# Patient Record
Sex: Female | Born: 1945 | Race: White | Hispanic: No | Marital: Married | Smoking: Light tobacco smoker
Health system: Southern US, Community
[De-identification: ages and names within clinical notes are randomized; demographics above are authoritative.]

## PROBLEM LIST (undated history)

## (undated) DIAGNOSIS — E785 Hyperlipidemia, unspecified: Secondary | ICD-10-CM

## (undated) DIAGNOSIS — F419 Anxiety disorder, unspecified: Secondary | ICD-10-CM

## (undated) DIAGNOSIS — K219 Gastro-esophageal reflux disease without esophagitis: Secondary | ICD-10-CM

## (undated) DIAGNOSIS — Q453 Other congenital malformations of pancreas and pancreatic duct: Principal | ICD-10-CM

## (undated) DIAGNOSIS — R748 Abnormal levels of other serum enzymes: Secondary | ICD-10-CM

## (undated) DIAGNOSIS — E039 Hypothyroidism, unspecified: Secondary | ICD-10-CM

## (undated) HISTORY — DX: Gastro-esophageal reflux disease without esophagitis: K21.9

## (undated) HISTORY — DX: Hyperlipidemia, unspecified: E78.5

## (undated) HISTORY — PX: UPPER GASTROINTESTINAL ENDOSCOPY: SHX188

## (undated) HISTORY — PX: ESOPHAGOGASTRODUODENOSCOPY: SHX1529

## (undated) HISTORY — PX: TUBAL LIGATION: SHX77

## (undated) HISTORY — PX: COLONOSCOPY: SHX174

## (undated) HISTORY — DX: Abnormal levels of other serum enzymes: R74.8

## (undated) HISTORY — DX: Hypothyroidism, unspecified: E03.9

## (undated) HISTORY — DX: Other congenital malformations of pancreas and pancreatic duct: Q45.3

---

## 2002-04-12 ENCOUNTER — Encounter: Payer: Self-pay | Admitting: Internal Medicine

## 2002-04-12 ENCOUNTER — Ambulatory Visit (HOSPITAL_COMMUNITY): Admission: RE | Admit: 2002-04-12 | Discharge: 2002-04-12 | Payer: Self-pay | Admitting: Internal Medicine

## 2002-05-04 ENCOUNTER — Encounter: Payer: Self-pay | Admitting: Internal Medicine

## 2002-05-04 ENCOUNTER — Ambulatory Visit (HOSPITAL_COMMUNITY): Admission: RE | Admit: 2002-05-04 | Discharge: 2002-05-04 | Payer: Self-pay | Admitting: Internal Medicine

## 2002-07-26 ENCOUNTER — Ambulatory Visit (HOSPITAL_COMMUNITY): Admission: RE | Admit: 2002-07-26 | Discharge: 2002-07-26 | Payer: Self-pay | Admitting: Internal Medicine

## 2003-02-19 ENCOUNTER — Encounter: Payer: Self-pay | Admitting: Emergency Medicine

## 2003-02-19 ENCOUNTER — Emergency Department (HOSPITAL_COMMUNITY): Admission: EM | Admit: 2003-02-19 | Discharge: 2003-02-19 | Payer: Self-pay | Admitting: Emergency Medicine

## 2005-10-28 ENCOUNTER — Ambulatory Visit (HOSPITAL_COMMUNITY): Admission: RE | Admit: 2005-10-28 | Discharge: 2005-10-28 | Payer: Self-pay | Admitting: Obstetrics and Gynecology

## 2007-04-27 ENCOUNTER — Encounter: Payer: Self-pay | Admitting: Internal Medicine

## 2007-05-29 ENCOUNTER — Encounter: Payer: Self-pay | Admitting: Internal Medicine

## 2007-05-29 HISTORY — PX: COLONOSCOPY: SHX174

## 2007-06-13 ENCOUNTER — Encounter: Payer: Self-pay | Admitting: Internal Medicine

## 2007-08-11 ENCOUNTER — Ambulatory Visit (HOSPITAL_COMMUNITY): Admission: RE | Admit: 2007-08-11 | Discharge: 2007-08-11 | Payer: Self-pay | Admitting: *Deleted

## 2007-08-15 ENCOUNTER — Emergency Department (HOSPITAL_COMMUNITY): Admission: EM | Admit: 2007-08-15 | Discharge: 2007-08-15 | Payer: Self-pay | Admitting: Emergency Medicine

## 2008-02-28 ENCOUNTER — Encounter: Payer: Self-pay | Admitting: Internal Medicine

## 2008-03-19 ENCOUNTER — Ambulatory Visit (HOSPITAL_COMMUNITY): Admission: RE | Admit: 2008-03-19 | Discharge: 2008-03-19 | Payer: Self-pay | Admitting: Family Medicine

## 2008-03-21 ENCOUNTER — Ambulatory Visit (HOSPITAL_COMMUNITY): Admission: RE | Admit: 2008-03-21 | Discharge: 2008-03-21 | Payer: Self-pay | Admitting: Family Medicine

## 2009-07-09 ENCOUNTER — Ambulatory Visit (HOSPITAL_COMMUNITY): Admission: RE | Admit: 2009-07-09 | Discharge: 2009-07-09 | Payer: Self-pay | Admitting: Family Medicine

## 2009-08-01 ENCOUNTER — Encounter: Payer: Self-pay | Admitting: Internal Medicine

## 2009-12-20 HISTORY — PX: BRAVO PH STUDY: SHX5421

## 2009-12-23 ENCOUNTER — Encounter: Payer: Self-pay | Admitting: Internal Medicine

## 2009-12-24 ENCOUNTER — Encounter: Payer: Self-pay | Admitting: Internal Medicine

## 2010-01-02 ENCOUNTER — Ambulatory Visit (HOSPITAL_COMMUNITY): Admission: RE | Admit: 2010-01-02 | Discharge: 2010-01-02 | Payer: Self-pay | Admitting: Internal Medicine

## 2010-01-02 ENCOUNTER — Encounter: Payer: Self-pay | Admitting: Internal Medicine

## 2010-02-12 ENCOUNTER — Encounter (HOSPITAL_COMMUNITY): Admission: RE | Admit: 2010-02-12 | Discharge: 2010-03-14 | Payer: Self-pay | Admitting: Internal Medicine

## 2010-02-12 ENCOUNTER — Encounter (INDEPENDENT_AMBULATORY_CARE_PROVIDER_SITE_OTHER): Payer: Self-pay | Admitting: *Deleted

## 2010-02-18 ENCOUNTER — Encounter (INDEPENDENT_AMBULATORY_CARE_PROVIDER_SITE_OTHER): Payer: Self-pay | Admitting: *Deleted

## 2010-02-20 ENCOUNTER — Telehealth: Payer: Self-pay | Admitting: Internal Medicine

## 2010-02-23 ENCOUNTER — Encounter: Payer: Self-pay | Admitting: Internal Medicine

## 2010-02-25 ENCOUNTER — Ambulatory Visit: Payer: Self-pay | Admitting: Internal Medicine

## 2010-02-25 ENCOUNTER — Telehealth: Payer: Self-pay | Admitting: Internal Medicine

## 2010-02-25 DIAGNOSIS — K219 Gastro-esophageal reflux disease without esophagitis: Secondary | ICD-10-CM | POA: Insufficient documentation

## 2010-02-25 DIAGNOSIS — J029 Acute pharyngitis, unspecified: Secondary | ICD-10-CM | POA: Insufficient documentation

## 2010-03-12 ENCOUNTER — Telehealth: Payer: Self-pay | Admitting: Internal Medicine

## 2010-04-07 ENCOUNTER — Encounter (INDEPENDENT_AMBULATORY_CARE_PROVIDER_SITE_OTHER): Payer: Self-pay | Admitting: *Deleted

## 2010-04-17 ENCOUNTER — Telehealth: Payer: Self-pay | Admitting: Internal Medicine

## 2010-04-20 ENCOUNTER — Encounter (INDEPENDENT_AMBULATORY_CARE_PROVIDER_SITE_OTHER): Payer: Self-pay | Admitting: *Deleted

## 2010-04-20 ENCOUNTER — Ambulatory Visit: Payer: Self-pay | Admitting: Internal Medicine

## 2010-04-28 ENCOUNTER — Ambulatory Visit (HOSPITAL_COMMUNITY): Admission: RE | Admit: 2010-04-28 | Discharge: 2010-04-28 | Payer: Self-pay | Admitting: Internal Medicine

## 2010-04-28 ENCOUNTER — Ambulatory Visit: Payer: Self-pay | Admitting: Internal Medicine

## 2010-04-28 ENCOUNTER — Telehealth: Payer: Self-pay | Admitting: Gastroenterology

## 2010-05-05 ENCOUNTER — Telehealth: Payer: Self-pay | Admitting: Internal Medicine

## 2010-09-08 ENCOUNTER — Telehealth: Payer: Self-pay | Admitting: Internal Medicine

## 2011-01-10 ENCOUNTER — Encounter: Payer: Self-pay | Admitting: Family Medicine

## 2011-01-10 ENCOUNTER — Encounter: Payer: Self-pay | Admitting: Internal Medicine

## 2011-01-19 NOTE — Progress Notes (Signed)
Summary: speak to stephanie  Phone Note Call from Patient Call back at Home Phone [phone]   Caller: Patient Call For: Leone Payor Reason for Call: Talk to Nurse Summary of Call: Patient wants to speak directly to [facility] regarding an rx Initial call taken by: Tawni Levy,  March 12, 2010 2:08 PM  Follow-up for Phone Call        message left for pt to return call. Francee Piccolo CMA Duncan Dull)  March 12, 2010 3:36 PM   RC from pt.  She states the Dexilant is helping.  She is still having symptoms, but they are better.  Pt states she is also taking a teaspoon of honey before eating and this is helping.  Please advise  of plan. Follow-up by: Francee Piccolo CMA Duncan Dull),  March 13, 2010 2:34 PM  Additional Follow-up for Phone Call Additional follow up Details #1::        see rx honey is up to her - I cam neutral on that she may need the discount card REV 2months Additional Follow-up by: [name] MD, Clementeen Graham,  March 15, 2010 4:38 PM    Additional Follow-up for Phone Call Additional follow up Details #2::    RC from pt.  Advised of above.  I mailed pt a GERD diet and discount card.  Appt made for 05/04/10 @ 2pm.  Pt will call before then if symptoms are not better. Follow-up by: Francee Piccolo CMA Duncan Dull),  March 17, 2010 3:01 PM  New/Updated Medications: DEXILANT 60 MG CPDR (DEXLANSOPRAZOLE) Take 1 tablet by mouth once a day 30 minutes before breakfast Prescriptions: DEXILANT 60 MG CPDR (DEXLANSOPRAZOLE) Take 1 tablet by mouth once a day 30 minutes before breakfast  #30 x 11   Entered and Authorized by:   [name] MD, [facility]   Signed by:   [name] MD, [facility] on 03/15/2010   Method used:   Electronically to        [facility] (retail)       [address]       [place], [place]  [address]       Ph: [phone]       Fax: [phone]   RxID:   [id number]

## 2011-01-19 NOTE — Assessment & Plan Note (Signed)
Summary: REFLUX, DEXILANT NOT WORKING/SP   History of Present Illness Visit Type: Follow-up Visit Primary GI MD: Stan Head MD [facility] Primary Provider: Elfredia Nevins, MD Chief Complaint: Acid Reflux and some dysphagia History of Present Illness:   65 yo woman with GERD on chronic PPI. She feels like there is alot of snot coming down her throat in the AM. Not sleeping well, having sweats from the neck up (same since menopause). She started black cohash nut not on it long. She has eliminated caffeine. She still has burning n her throat.  Dexilant helped some, better than other PPI's but problems not eliminated. Still feels like something is sitting in her throat, occasionally. No lear relation to food, etc.    GI Review of Systems    Reports acid reflux, dysphagia with solids, and  heartburn.      Denies abdominal pain, belching, bloating, chest pain, dysphagia with liquids, loss of appetite, nausea, vomiting, vomiting blood, weight loss, and  weight gain.        Denies anal fissure, black tarry stools, change in bowel habit, constipation, diarrhea, diverticulosis, fecal incontinence, heme positive stool, hemorrhoids, irritable bowel syndrome, jaundice, light color stool, liver problems, rectal bleeding, and  rectal pain.    Current Medications (verified): 1)  Levothyroxine Sodium 100 Mcg Tabs (Levothyroxine Sodium) .... Take One By Mouth Once Daily 2)  Dexilant 60 Mg Cpdr (Dexlansoprazole) .... Take 1 Tablet By Mouth Once A Day 30 Minutes Before Breakfast 3)  Carafate 1 Gm/30ml Susp (Sucralfate) .... Take 2 Tsp By Mouth Four Times A Day--Hold 4)  Fish Oil 1000 Mg Caps (Omega-3 Fatty Acids) .... Take Three By Mouth Once Daily 5)  Milk Thistle 140 Mg Caps (Milk Thistle) .... Take One By Mouth Once Daily 6)  B Complex  Tabs (B Complex Vitamins) .... Take One By Mouth Once Daily 7)  Cinnamon 500 Mg Caps (Cinnamon) .... Take Two By Mouth Once Daily 8)  Calcium 1200-1000 Mg-Unit Chew  (Calcium Carbonate-Vit D-Min) .... Take One By Mouth Once Daily 9)  Co-Enzyme Q-10 100 Mg Caps (Coenzyme Q10) .... Take One By Mouth Once Daily 10)  Acidophilus  Caps (Lactobacillus) .... Take One By Mouth Once Daily  Allergies (verified): No Known Drug Allergies  Past History:  Past Medical History: Reviewed history from 02/25/2010 and no changes required. Anxiety Disorder GERD/LPR...Marland KitchenMarland KitchenBates Hyperlipidemia Hypothyroidism  Past Surgical History: Reviewed history from 02/25/2010 and no changes required. Tubal Ligation  Family History: Reviewed history from 02/25/2010 and no changes required. Family History of Colon Cancer: MGF Family History of Diabetes: Mother Family History of Heart Disease: mother  Social History: Reviewed history from 02/25/2010 and no changes required. 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  Alcohol Use - no Daily Caffeine Use 4-5 per day Illicit Drug Use - no  Review of Systems       still with anxiety over mother and mother-in-law's health problems  Vital Signs:  Patient profile:   65 year old female Height:      61 inches Weight:      148 pounds BMI:     28.07 Pulse rhythm:   regular BP sitting:   122 / 72  (left arm)  Vitals Entered By: Milford Cage NCMA (Apr 20, 2010 2:50 PM)  Physical Exam  General:  Well developed, well nourished, no acute distress. Mouth:  mild erythema at palatal folds and uvula eographic togue Lungs:  Clear throughout to auscultation. Heart:  Regular rate and rhythm; no  murmurs, rubs,  or bruits.   Impression & Recommendations:  Problem # 1:  GERD (ICD-530.81) Assessment Unchanged  ? slightly better on Dexilant as opposed to other PPI's (seems like heartburn mainly controlled) ? if she has LPR  Orders: ZEGD (ZEGD)  Problem # 2:  SORE THROAT (ICD-462) Assessment: Unchanged ? if GERD is cause or other ENT causes plan for EGD/Bravo pH probe on Dexilantnow, ? repeat ENT  assessment  Patient Instructions: 1)  Copy sent to : Christia Reading, MD and Elfredia Nevins, MD 2)  Upper Endoscopy brochure given.  3)  We will see you at your procedure on 04/28/10  4)  The medication list was reviewed and reconciled.  All changed / newly prescribed medications were explained.  A complete medication list was provided to the patient / caregiver.

## 2011-01-19 NOTE — Assessment & Plan Note (Signed)
Summary: HEARTBURN,REFLUX/SP   History of Present Illness Visit Type: Initial Visit Primary GI MD: Stan Head MD [facility] Primary Provider: Elfredia Nevins, MD Chief Complaint: Mid back pain and burning in her throat History of Present Illness:   65 yo white woman with throat burning is worried about cancer 03/02/07 EGD normal (patient info) Was to do research on GERD has seen bates throat burning and neck, especially right side and is sore in right submandibuar Dr. Jenne Pane did laryngoscopy.  Nexium used to work fine x years in January she had a bitternes in mouth x 3-4 days  then had galbadder [place] and HIDA ef ok sxs better can get epigastric pain after apple  Now on Aciphex and Carafate not helping took 1 abx pill this weekend (leftover)  some throat tightness and anxiety with mother's alzheimer's mother-in law ill also   Records from Dr. Jenne Pane in after visit: February 28, 2008 visit. She was complaining of throat pain, she had a change from AcipHex to Nexium for burning in the throat and bitter taste at that time. Also had right chest pain. 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 August 01, 2009 visit with ear pain and chronic sinus symptoms, CT of the sinuses negative. She was thought to have some TMJ pain. Ibuprofen was given and Nexium was continued. December 24, 2009 visits for sore throat. 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 Nexium was increased to 40 mg b.i.d.  records for PCP with normal CBC and seeing that December 24, 2009. Lipase was 79 with top normal 75 amylase 39. Office note from March 7 11 she had throat pain. AcipHex was increased to 40 mg b.i.d. and Carafate added. December 23, 2009 note Nexium was increased to b.i.d. at that time that was around the time she had seen Dr. Jenne Pane and she was complaining of right upper quadrant pain and a gallbladder ultrasound and  HIDA scan were unrevealing.   GI Review of Systems    Reports acid reflux, belching, and  bloating.      Denies abdominal pain, chest pain, dysphagia with liquids, dysphagia with solids, heartburn, loss of appetite, nausea, vomiting, vomiting blood, weight loss, and  weight gain.      Reports hemorrhoids.     Denies anal fissure, black tarry stools, change in bowel habit, constipation, diarrhea, diverticulosis, fecal incontinence, heme positive stool, irritable bowel syndrome, jaundice, light color stool, liver problems, rectal bleeding, and  rectal pain. Preventive Screening-Counseling & Management  Alcohol-Tobacco     Smoking Status: quit      Drug Use:  no.      Current Medications (verified): 1)  Levothyroxine Sodium 100 Mcg Tabs (Levothyroxine Sodium) .... Take One By Mouth Once Daily 2)  Aciphex 20 Mg Tbec (Rabeprazole Sodium) .... Take One By Mouth Two Times A Day 3)  Carafate 1 Gm/56ml Susp (Sucralfate) .... Take 2 Tsp By Mouth Four Times A Day 4)  Fish Oil 1000 Mg Caps (Omega-3 Fatty Acids) .... Take Three By Mouth Once Daily 5)  Milk Thistle 140 Mg Caps (Milk Thistle) .... Take One By Mouth Once Daily 6)  B Complex  Tabs (B Complex Vitamins) .... Take One By Mouth Once Daily 7)  Cinnamon 500 Mg Caps (Cinnamon) .... Take Two By Mouth Once Daily 8)  Calcium 1200-1000 Mg-Unit Chew (Calcium Carbonate-Vit D-Min) .... Take One By Mouth Once Daily 9)  Co-Enzyme Q-10 100 Mg Caps (  Coenzyme Q10) .... Take One By Mouth Once Daily 10)  Acidophilus  Caps (Lactobacillus) .... 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  Alcohol Use - no Daily Caffeine Use 4-5 per day Illicit Drug Use - no Smoking Status:  quit Drug Use:  no  Review of Systems       The patient complains of allergy/sinus, back pain, and night sweats.         All other ROS negative except as per HPI.   Vital Signs:  Patient profile:   65 year old female Height:      61 inches Weight:      150 pounds BMI:     28.44 Pulse rate:   80 / minute Pulse rhythm:   regular BP sitting:   110 / 70  (left arm) Cuff size:   regular  Vitals Entered By: Harlow Mares CMA Duncan Dull) (February 25, 2010 2:36 PM)  Physical Exam  General:  Well developed, well nourished, no acute distress. Eyes:  PERRLA, no icterus. Mouth:  No deformity or lesions, dentition normal. Neck:  mildly tender in right submandibular area, no mass or nodes felt Chest Wall:  nontender Lungs:  Clear throughout to auscultation. Heart:  Regular rate and rhythm; no murmurs, rubs,  or bruits. Abdomen:  Soft, nontender and nondistended. No masses, hepatosplenomegaly or hernias noted. Normal bowel sounds. Extremities:  No clubbing, cyanosis, edema or deformities noted. Neurologic:  Alert and  oriented x4;  grossly normal neurologically. Cervical Nodes:  No significant cervical or supraclavicular adenopathy.  Psych:  Alert and cooperative. Normal mood and affect.   Impression & Recommendations:  Problem # 1:  SORE THROAT (ICD-462) Assessment New ? LPR  or anxiety or other (doubt) switch to Dexilant as Aciphex (plus carafate) and Nexium have not helped.  ? pH probe study. It may be necessary vs. impedance. In my experience Carafate has not been that helpful for LPR. She will hold that. Note that EGD in 2008 was normal, we have documentation that.  As far as anxiety is concerned she could have some globus-like problems or muscle tightness related to that is in at the phenomenon. She clearly admits anxiety over her mother and mother-in-law's health problems,  particularly her mother's Alzheimer's.  Problem # 2:  GERD (ICD-530.81) Assessment: New Soundsl like she does have this but ? is whether or notshe has LPR also. Await trial of Dexilant.  Patient Instructions: 1)  Please hold the Carafate. 2)  Please stop the AcipHex and begin taking Dexilant once daily.  If the Dexilant is helping call us and we will call in a prescription. 3)  We will obtain records from Dr. Jenne Pane and call you with follow up.  If you have not heard from our office by 3/23 please call us. 4)  Please schedule a follow-up appointment in 8 weeks.  5)  Copy sent to : Artis Delay, MD, Christia Reading, MD 6)  The medication list was reviewed and reconciled.  All changed / newly prescribed medications were explained.  A complete medication list was provided to the patient / caregiver.  Appended Document: HEARTBURN,REFLUX/SP   EGD  Procedure date:  03/02/2007  Findings:      Findings: Normal     Appended Document: HEARTBURN,REFLUX/SP   Colonoscopy  Procedure date:  05/29/2007  Findings:      Internal Hemoorhoids diminutive polyps NOT ADENOMAS  Procedures Next Due Date:    Colonoscopy: 05/2017

## 2011-01-19 NOTE — Procedures (Signed)
Summary: Bravo/[facility]  Bravo/[facility]   Imported By: Lester De Pere 05/11/2010 10:16:15  _____________________________________________________________________  External Attachment:    Type:   Image     Comment:   External Document

## 2011-01-19 NOTE — Letter (Signed)
Summary: EGD Instructions  [facility]  [address] [place], [place] [address]   Phone: [phone]  Fax: [phone]       Ana Gutierrez    01/18/1946    MRN: [id number]       Procedure Day Dorna Bloom: Jake Shark, 04/28/10     Arrival Time: 8:00 AM     Procedure Time: 9:00 AM     Location of Procedure:                    _X_ [facility] ( Outpatient Registration)  PREPARATION FOR ENDOSCOPY   On TUESDAY, 04/28/10 THE DAY OF THE PROCEDURE:  1.   No solid foods, milk or milk products are allowed after midnight the night before your procedure.  2.   Do not drink anything colored red or purple.  Avoid juices with pulp.  No orange juice.  3.  You may drink clear liquids until 5:00 AM, which is 4 hours before your procedure.                                                                                                CLEAR LIQUIDS INCLUDE: Water Jello Ice Popsicles Tea (sugar ok, no milk/cream) Powdered fruit flavored drinks Coffee (sugar ok, no milk/cream) Gatorade Juice: apple, white grape, white cranberry  Lemonade Clear bullion, consomm, broth Carbonated beverages (any kind) Strained chicken noodle soup Hard Candy   MEDICATION INSTRUCTIONS  Unless otherwise instructed, you should take regular prescription medications with a small sip of water as early as possible the morning of your procedure.                  OTHER INSTRUCTIONS  You will need a responsible adult at least 65 years of age to accompany you and drive you home.   This person must remain in the waiting room during your procedure.  Wear loose fitting clothing that is easily removed.  Leave jewelry and other valuables at home.  However, you may wish to bring a book to read or an iPod/MP3 player to listen to music as you wait for your procedure to start.  Remove all body piercing jewelry and leave at home.  Total time from sign-in until discharge is approximately 2-3  hours.  You should go home directly after your procedure and rest.  You can resume normal activities the day after your procedure.  The day of your procedure you should not:   Drive   Make legal decisions   Operate machinery   Drink alcohol   Return to work  You will receive specific instructions about eating, activities and medications before you leave.    The above instructions have been reviewed and explained to me by   [facility], CMA    I fully understand and can verbalize these instructions _____________________________ Date _________

## 2011-01-19 NOTE — Progress Notes (Signed)
  Phone Note Call from Patient   Summary of Call: pt called tongiht at 8pm.  mild, bilateral chest pains ("like indigestion") without SOB, swallowing problems.  Ate well. no h/o CAD.   I explained that this could be from the bravo probe and told her to take 2 alleve tonight  call if things worsen Initial call taken by: Rachael Fee MD,  Apr 28, 2010 8:08 PM

## 2011-01-19 NOTE — Letter (Signed)
Summary: New Patient letter  [facility]  [address] [place], [place] [address]   Phone: [phone]  Fax: [phone]       02/18/2010 MRN: [id number]  Ana Gutierrez [address] [place], [place]  [address]  Dear Ana Gutierrez,  Welcome to the Gastroenterology Division at [facility].    You are scheduled to see Dr. Leone Payor on 03/24/2010 at 1:45PM on the 3rd floor at [facility], [address]  We ask that you try to arrive at our office 15 minutes prior to your appointment time to allow for check-in.  We would like you to complete the enclosed self-administered evaluation form prior to your visit and bring it with you on the day of your appointment.  We will review it with you.  Also, please bring a complete list of all your medications or, if you prefer, bring the medication bottles and we will list them.  Please bring your insurance card so that we may make a copy of it.  If your insurance requires a referral to see a specialist, please bring your referral form from your primary care physician.  Co-payments are due at the time of your visit and may be paid by cash, check or credit card.     Your office visit will consist of a consult with your physician (includes a physical exam), any laboratory testing he/she may order, scheduling of any necessary diagnostic testing (e.g. x-ray, ultrasound, CT-scan), and scheduling of a procedure (e.g. Endoscopy, Colonoscopy) if required.  Please allow enough time on your schedule to allow for any/all of these possibilities.    If you cannot keep your appointment, please call [phone] to cancel or reschedule prior to your appointment date.  This allows [place] the opportunity to schedule an appointment for another patient in need of care.  If you do not cancel or reschedule by 5 p.m. the business day prior to your appointment date, you will be charged a $50.00 late cancellation/no-show fee.    Thank you for choosing  [facility] for your medical needs.  We appreciate the opportunity to care for you.  Please visit [place] at our website  to learn more about our practice.                     Sincerely,                                                             The Gastroenterology Division

## 2011-01-19 NOTE — Progress Notes (Signed)
Summary: Bravo results call  Phone Note Outgoing Call   Summary of Call: call her and let her know there is no sig acid reflux based upon Bravo study she should see Dr. Jenne Pane again re: cough and nasl drainage if that remains unhelpful will get  pulmonary eval please look for and copy/paste dictated Bravo report [name] MD, [facility]  May 05, 2010 3:20 PM   Follow-up for Phone Call        Left message for patient to call back Darcey Nora RN, [facility]  May 06, 2010 11:36 AM  patient aware .  She reports no further problems.  She is advised to see Dr Jenne Pane.  She reports that if her symptoms return she will callDr Jenne Pane and make an appointment. Follow-up by: Darcey Nora RN, CGRN,  May 06, 2010 2:18 PM

## 2011-01-19 NOTE — Procedures (Signed)
Summary: Prep/[facility]  Prep/[facility]   Imported By: Lester  04/22/2010 10:21:01  _____________________________________________________________________  External Attachment:    Type:   Image     Comment:   External Document

## 2011-01-19 NOTE — Letter (Signed)
Summary: [facility]  [facility]   Imported By: Sherian Rein 02/27/2010 14:32:44  _____________________________________________________________________  External Attachment:    Type:   Image     Comment:   External Document

## 2011-01-19 NOTE — Progress Notes (Signed)
Summary: advise follow-up  Phone Note Outgoing Call   Summary of Call: Let her know I saw records from Dr. Jenne Pane. Have her call us in 2 weeks with symptom report and then we can decide if need to Rx Dexilant as is on samples [name] MD, [facility]  February 25, 2010 6:20 PM   Follow-up for Phone Call        message left to return call. Francee Piccolo CMA (AAMA)  February 26, 2010 9:05 AM   RC from pt.  I advised her of follow up plan.  She will continue Dexilant and call me in one month with status report. Follow-up by: Francee Piccolo CMA Duncan Dull),  February 26, 2010 3:07 PM

## 2011-01-19 NOTE — Letter (Signed)
Summary: [facility]  [facility]   Imported By: Sherian Rein 02/27/2010 14:34:04  _____________________________________________________________________  External Attachment:    Type:   Image     Comment:   External Document

## 2011-01-19 NOTE — Letter (Signed)
Summary: Anselmo Rod MD  Anselmo Rod MD   Imported By: Lester [place] 03/18/2010 07:29:13  _____________________________________________________________________  External Attachment:    Type:   Image     Comment:   External Document

## 2011-01-19 NOTE — Letter (Signed)
Summary: [facility]  [facility]   Imported By: Sherian Rein 02/27/2010 14:34:56  _____________________________________________________________________  External Attachment:    Type:   Image     Comment:   External Document

## 2011-01-19 NOTE — Progress Notes (Signed)
Summary: sooner appt.  Phone Note Call from Patient Call back at [facility] Phone [phone]   Caller: Patient Call For: Dr. Leone Payor Reason for Call: Talk to Nurse Summary of Call: Pt has an appt. on 03-24-10 and wants a sooner appt. for heartburn/acid reflux Initial call taken by: Karna Christmas,  February 20, 2010 3:30 PM  Follow-up for Phone Call        Left message for patient to call back Darcey Nora RN, [facility]  February 23, 2010 11:21 AM Left message for patient to call back Darcey Nora RN, [facility]  February 23, 2010 2:42 PM  Left message for patient to call back Darcey Nora RN, [facility]  February 24, 2010 9:16 AM  Additional Follow-up for Phone Call Additional follow up Details #1::        Pt c/o heartburn all the time that does not respond to AcipHex.  Pt began taking AcipHex two times a day on 3/7, but has not noticed any difference.  She is also taking Carafate two times a day (rx'd four times a day) and states this seems to have made a difference.  Pt states she has also had tests done at Dr. Jenne Pane office.  Pt has had abdominal ultrasound at [facility].  Pt also has GI hx with Dr. Loreta Ave in 2007. Pt scheduled for 3/9 at 2:30pm. Additional Follow-up by: Francee Piccolo CMA Duncan Dull),  February 24, 2010 1:47 PM

## 2011-01-19 NOTE — Letter (Signed)
Summary: [facility]   Imported By: Sherian Rein 02/27/2010 14:31:01  _____________________________________________________________________  External Attachment:    Type:   Image     Comment:   External Document

## 2011-01-19 NOTE — Procedures (Signed)
Summary: Upper Endoscopy  Patient: Ana Gutierrez Note: All result statuses are Final unless otherwise noted.  Tests: (1) Upper Endoscopy (EGD)   EGD Upper Endoscopy       DONE     [facility]     [address] [place], [place]  [address]           ENDOSCOPY PROCEDURE REPORT           PATIENT:  Ana, Gutierrez  MR#:  [id number]     BIRTHDATE:  1946/08/26, 63 yrs. old  GENDER:  female           ENDOSCOPIST:  [name], MD, [facility]           PROCEDURE DATE:  04/28/2010     PROCEDURE:  EGD, diagnostic, Bravo pH probe placement     ASA CLASS:  Class II     INDICATIONS:  ? of laryngopharyngeal reflux causing pharyngeal     symptoms     she is on Dexilant 60 mg daily           MEDICATIONS:   Fentanyl 50 mcg IV, Versed 4 mg IV     TOPICAL ANESTHETIC:  Cetacaine Spray           DESCRIPTION OF PROCEDURE:   After the risks benefits and     alternatives of the procedure were thoroughly explained, informed     consent was obtained.  The  endoscope was introduced through the     mouth and advanced to the second portion of the duodenum, without     limitations.  The instrument was slowly withdrawn as the mucosa     was fully examined.     <<PROCEDUREIMAGES>>           The upper, middle, and distal third of the esophagus were     carefully inspected and no abnormalities were noted. The z-line     was well seen at the GEJ. The endoscope was pushed into the fundus     which was normal including a retroflexed view. The antrum,gastric     body, first and second part of the duodenum were unremarkable.     Z-line at 35 cm.    Retroflexed views revealed no abnormalities.     The scope was then withdrawn from the patient and teh Bravo probe     was plavced at 29 cm from the incisors. The gastroscope was     reinserted and Bravo placement confirmed, and the scope was again     withdrawan and the procedurewas completed.           COMPLICATIONS:  None           ENDOSCOPIC  IMPRESSION:     1) Normal EGD - successful Bravo pH probe placement.     RECOMMENDATIONS:     1) Await BRAVO results - will notify     2) Continue current mdictions including Dexilant while Bravo     monitoring occurring.           REPEAT EXAM:  In for as needed.           [name], MD, Clementeen Graham           CC:  Christia Reading, MD     The Patient     Elfredia Nevins, MD           n.     Rosalie Doctor:   Maryjean Morn.  Kyeshia Zinn at 04/28/2010 10:10 AM           Emalie, Mcwethy Patmos, [phone]  Note: An exclamation mark (!) indicates a result that was not dispersed into the flowsheet. Document Creation Date: 04/28/2010 10:11 AM _______________________________________________________________________  (1) Order result status: Final Collection or observation date-time: 04/28/2010 09:58 Requested date-time:  Receipt date-time:  Reported date-time:  Referring Physician:   Ordering Physician: Stan Head [id number]) Specimen Source:  Source: [name] Order Number: [id number] Lab site:

## 2011-01-19 NOTE — Progress Notes (Signed)
Summary: move appt up  Phone Note Call from Patient Call back at Home Phone [phone]   Caller: Patient Call For: Dr. Leone Payor Reason for Call: Talk to Nurse Summary of Call: would like to discuss seeing Dr. Leone Payor sooner than currently sch'ed and asked specifically to speak with [facility] Initial call taken by: Vallarie Mare,  April 17, 2010 2:08 PM  Follow-up for Phone Call        Pt still complains of burning and reflux. Pt states she is using rolaids during the day in addition to Dexilant.  Appt given for 04/20/10 @ 3:15 to see Dr. Leone Payor. Follow-up by: Francee Piccolo CMA Duncan Dull),  April 17, 2010 3:59 PM

## 2011-01-19 NOTE — Letter (Signed)
Summary: [facility]   Imported By: Sherian Rein 02/27/2010 14:29:48  _____________________________________________________________________  External Attachment:    Type:   Image     Comment:   External Document

## 2011-01-19 NOTE — Letter (Signed)
Summary: Anselmo Rod MD  Anselmo Rod MD   Imported By: Lester [place] 03/18/2010 07:32:33  _____________________________________________________________________  External Attachment:    Type:   Image     Comment:   External Document

## 2011-01-19 NOTE — Letter (Signed)
Summary: Office Visit Letter  [facility]  [address] [place], [place] [address]   Phone: [phone]  Fax: [phone]      April 07, 2010 MRN: [id number]   Ana Gutierrez [address] [place], [place]  [address]   Dear [name],   According to our records, it is time for you to schedule a follow-up office visit with [place].   At your convenience, please call [phone] (option #2)to schedule an office visit. If you have any questions, concerns, or feel that this letter is in error, we would appreciate your call.   Sincerely,    [name], M.D.  [facility] [phone]

## 2011-01-19 NOTE — Progress Notes (Signed)
Summary: Samples  Phone Note Call from Patient Call back at Home Phone [phone]   Caller: Patient Call For: Dr. Leone Payor Reason for Call: Refill Medication Summary of Call: Asking for samples of Dexilant.....husband was laid off and does not have Ins.  Initial call taken by: Karna Christmas,  September 08, 2010 3:45 PM  Follow-up for Phone Call        Notified pt that samples are at front desk. Follow-up by: Francee Piccolo CMA Duncan Dull),  September 08, 2010 3:59 PM    New/Updated Medications: DEXILANT 60 MG CPDR (DEXLANSOPRAZOLE) Take 1 tablet by mouth once a day 30 minutes before breakfast

## 2011-01-19 NOTE — Procedures (Signed)
Summary: Anselmo Rod MD  Anselmo Rod MD   Imported By: Lester [place] 03/18/2010 07:35:52  _____________________________________________________________________  External Attachment:    Type:   Image     Comment:   External Document

## 2011-09-24 ENCOUNTER — Other Ambulatory Visit (HOSPITAL_COMMUNITY): Payer: Self-pay | Admitting: Internal Medicine

## 2011-09-24 DIAGNOSIS — Z139 Encounter for screening, unspecified: Secondary | ICD-10-CM

## 2011-09-30 ENCOUNTER — Ambulatory Visit (HOSPITAL_COMMUNITY): Payer: Self-pay

## 2011-09-30 ENCOUNTER — Other Ambulatory Visit (HOSPITAL_COMMUNITY): Payer: Self-pay

## 2011-10-01 LAB — PROTIME-INR
INR: 1.2
Prothrombin Time: 15.8 — ABNORMAL HIGH

## 2011-10-01 LAB — DIFFERENTIAL
Basophils Relative: 0
Eosinophils Absolute: 0.1
Eosinophils Relative: 1
Monocytes Absolute: 0.5
Monocytes Relative: 7

## 2011-10-01 LAB — I-STAT 8, (EC8 V) (CONVERTED LAB)
Chloride: 109
Hemoglobin: 14.3
TCO2: 26

## 2011-10-01 LAB — APTT: aPTT: 30

## 2011-10-01 LAB — POCT I-STAT CREATININE: Creatinine, Ser: 0.8

## 2011-10-01 LAB — POCT CARDIAC MARKERS
CKMB, poc: <1 — ABNORMAL LOW
Myoglobin, poc: 47.8
Troponin i, poc: <0.05

## 2011-10-01 LAB — CBC
HCT: 40
MCHC: 34.5
MCV: 92.9
Platelets: 266
RBC: 4.3
RDW: 13.3

## 2011-10-04 ENCOUNTER — Ambulatory Visit (HOSPITAL_COMMUNITY)
Admission: RE | Admit: 2011-10-04 | Discharge: 2011-10-04 | Disposition: A | Discharge status: Home / Self Care | Payer: Medicare Other | Source: Ambulatory Visit | Attending: Internal Medicine | Admitting: Internal Medicine

## 2011-10-04 DIAGNOSIS — Z1231 Encounter for screening mammogram for malignant neoplasm of breast: Secondary | ICD-10-CM | POA: Insufficient documentation

## 2011-10-04 DIAGNOSIS — Z139 Encounter for screening, unspecified: Secondary | ICD-10-CM

## 2011-10-04 DIAGNOSIS — Z780 Asymptomatic menopausal state: Secondary | ICD-10-CM | POA: Insufficient documentation

## 2011-10-04 DIAGNOSIS — M899 Disorder of bone, unspecified: Secondary | ICD-10-CM | POA: Insufficient documentation

## 2012-02-14 ENCOUNTER — Ambulatory Visit (INDEPENDENT_AMBULATORY_CARE_PROVIDER_SITE_OTHER): Payer: Medicare Other | Admitting: Family Medicine

## 2012-02-14 VITALS — BP 171/81 | HR 102 | Temp 98.0°F | Resp 16 | Ht 60.75 in | Wt 159.0 lb

## 2012-02-14 DIAGNOSIS — M25519 Pain in unspecified shoulder: Secondary | ICD-10-CM

## 2012-02-14 DIAGNOSIS — M62838 Other muscle spasm: Secondary | ICD-10-CM

## 2012-02-14 MED ORDER — CYCLOBENZAPRINE HCL 5 MG PO TABS: ORAL_TABLET | ORAL | Status: DC

## 2012-02-14 NOTE — Progress Notes (Signed)
  Subjective:    Patient ID: Ana Gutierrez, female    DOB: [birth year], 66 y.o.   MRN: [id number]  HPI 66 yo female with neck pain to left shoulder, occ to right, but worse on left.  Started a little over a week ago.  Worse with certain movements.  Had started zetia 3 months prior.  Stopped it when pain started but no change.  Off an on initially but tnow constant.  Shooting pain.  Sometimes and ache.  FROM with shoulder and no pain with shoulder movement.  Started after trying to do 50 sit-ups, crunches.    High BP - checks at home.  Usually 110's over 70's   Review of Systems Negative except as per HPI     Objective:   Physical Exam  Constitutional: She appears well-developed and well-nourished.  Cardiovascular: Normal rate, regular rhythm, normal heart sounds and intact distal pulses.   No murmur heard. Pulmonary/Chest: Effort normal and breath sounds normal.  Neurological: She is alert.  Skin: Skin is warm and dry.   L shoulder - no tenderness over c-spine.  TTP over lateral trap and upper pectoralis on left.  FROM shoulder without pain. Full strength.        Assessment & Plan:

## 2013-03-23 ENCOUNTER — Encounter: Payer: Self-pay | Admitting: Internal Medicine

## 2013-03-23 ENCOUNTER — Ambulatory Visit (INDEPENDENT_AMBULATORY_CARE_PROVIDER_SITE_OTHER): Payer: Medicare Other | Admitting: Internal Medicine

## 2013-03-23 VITALS — BP 130/78 | HR 64 | Temp 98.0°F | Resp 10 | Ht 60.5 in | Wt 162.0 lb

## 2013-03-23 DIAGNOSIS — E039 Hypothyroidism, unspecified: Secondary | ICD-10-CM

## 2013-03-23 NOTE — Patient Instructions (Addendum)
Please join MyChart, I will release the labs through there. Please return in 6 months for another check.

## 2013-03-23 NOTE — Progress Notes (Signed)
Subjective:     Patient ID: Ana Gutierrez, female   DOB: Mar 03, 1946, 67 y.o.   MRN: [id number]  HPI Ana Gutierrez is a 67 year old woman, self-referred for management of hypothyroidism. She has heard of our office from Dr. Evlyn Kanner.  She was dx with hypothyroidism ~2000 on the base of fatigue and lack of concentration. She has been maintained for a long time on a dose of levothyroxine 100 mcg daily. Several months ago she was started on Zetia for her hyperlipidemia since she could not tolerate statins. On 01/15/2013, her TSH returned at 9.359. Her levothyroxine dose was increased to 125 mcg daily. She also stopped Zetia around the same time. 8 mo before this, per her report, her TSH was normal, at 0.8. He tells me that she went through a difficult time during the last 8 months, as her mother passed in 67/2013 and she grieved and was fatigued and generally felt poorly. She had more energy in the last few days, but felt really tired.   She does complain that she gained at least 10 lbs in the last year. She has increased appetite and muscle aches but no other symptoms that could be attributed to hypothyroidism.  She takes Levothyroxine early in am with coffee. She eats 1.5 hours later. She takes her calcium at night. Not taking Mylanta anymore.   Meals: - Breakfast: yoghurt - Lunch: grapefruit, apple + piece of dark chocolate - Dinner: sandwich, salad, grilled chicken, maybe hash browns - Snacks: 2 a day: cheese sticks, dark chocolate No soft drinks. She does yoga at night. She walks 1-1/2 miles daily.  Reviewed records brought by patient at the time of the visit:  01/15/2013: CBC with differential normal; cholesterol panel 180/132/37/170; CMP normal. TSH is missing from the lab results, but there is a note from PCP to increase Synthroid to 125 mcg daily. 10/23/2009: TSH 0.8 10/04/2011: DEXA scan showing T score of -1.0 L1-L4 - normal, T score of -2.0 right femoral neck - osteopenia. These scores  are compared to 04/12/2002 when T score of 0.2 L1-L4, T score of -1.3 right femoral neck. Patient mentions that she has been on Fosamax.  Review of Systems Constitutional: has weight gain, has fatigue, hot flashes, has increased appetite Eyes: no blurry vision, no xerophthalmia ENT: no sore throat, no nodules palpated in throat, no dysphagia/odynophagia, no hoarseness Cardiovascular: no CP/SOB/palpitations/leg swelling Respiratory: no cough/SOB Gastrointestinal: no N/V/D/C Musculoskeletal: plus muscle aches/no joint aches Skin: no rashes, + itching Neurological: no tremors/numbness/tingling/dizziness Psychiatric: no depression/anxiety Low libido  Past medical history: - Hyperlipidemia - Hypothyroidism - Osteopenia  History reviewed. No pertinent past surgical history.  History   Social History  . Marital Status: Married    Spouse Name: N/A    Number of Children: 2   Occupational History  . Administrative assistant   Social History Main Topics  . Smoking status: Former Games developer  . Smokeless tobacco: No     Comment: Quit in 2009  . Alcohol Use: No  . Drug Use: No   Current Outpatient Prescriptions on File Prior to Visit  Medication Sig Dispense Refill  . b complex vitamins tablet Take 1 tablet by mouth daily.      Marland Kitchen co-enzyme Q-10 30 MG capsule Take 30 mg by mouth 3 (three) times daily.      . fish oil-omega-3 fatty acids 1000 MG capsule Take 2 g by mouth daily.      . calcium & magnesium carbonates (MYLANTA) 147-829 MG per tablet  Take 1 tablet by mouth daily.      . cyclobenzaprine (FLEXERIL) 5 MG tablet Take 1/2 to 1 pill BID prn pain or spasm  30 tablet  1   No current facility-administered medications on file prior to visit.   Allergies  Allergen Reactions  . Penicillins Other (See Comments)    Gives her really bad yeast infections   History reviewed. No pertinent family history.  Objective:   Physical Exam BP 130/78  Pulse 64  Temp(Src) 98 F (36.7 C)  (Oral)  Resp 10  Ht 5' 0.5" (1.537 m)  Wt 162 lb (73.483 kg)  BMI 31.11 kg/m2  SpO2 97% Wt Readings from Last 3 Encounters:  03/23/13 162 lb (73.483 kg)  02/14/12 159 lb (72.122 kg)  04/20/10 148 lb (67.132 kg)   Constitutional: overweight - truncal obesity, in NAD Eyes: PERRLA, EOMI, no exophthalmos ENT: moist mucous membranes, no thyromegaly, no cervical lymphadenopathy Cardiovascular: RRR, No MRG Respiratory: CTA B Gastrointestinal: abdomen soft, NT, ND, BS+ Musculoskeletal: no deformities, strength intact in all 4 Skin: moist, warm, no rashes Neurological: no tremor with outstretched hands, DTR normal in all 4  Assessment:     1. Hypothyroidism   2. Overweight    Plan:     1. Hypothyroidism Patient has long-standing hypothyroidism, previously well controlled, with one TSH value that was high, at 9. It is possible that addition of Zetia could have caused decrease absorption of the thyroid hormone, although this is not widely seen. - I explained the physiology of the pituitary-thyroid axis, and why we check difference thyroid tests  - we discussed about Armour thyroid, and I explained the disadvantages - We also discussed the advantages and disadvantages of being on brand name versus generic - for now I advised her to stay on the generic thyroid hormone she did well over the years on it - the fact that she is feeling better, is indicative of a probably normal TSH - I will check her TSH and free T4 today and let her know the results through my chart - I will see her back in 6 months    2. Overweight Patient also had many questions about why she is overweight, since she is eating so little. We discussed about a better organization of her meals, with preferably the biggest meal being breakfast, and the meals decreasing in size as the day goes by. I also advised her to try to limit eating out especially at night. - I also advised her to try to exercise a little bit more  intensely, if she can. She tries to stay as much activity she can, which is great.  Office Visit on 03/23/2013  Component Date Value Range Status  . TSH 03/23/2013 0.11* 0.35 - 5.50 uIU/mL Final  . Free T4 03/23/2013 1.29  0.60 - 1.60 ng/dL Final   TSH decreased after increasing the dose of levothyroxine, and after stopping Zetia. Will need to decrease dose to 112 mcg daily and recheck in 6 weeks.

## 2013-03-26 ENCOUNTER — Encounter: Payer: Self-pay | Admitting: Internal Medicine

## 2013-03-26 DIAGNOSIS — E039 Hypothyroidism, unspecified: Secondary | ICD-10-CM | POA: Insufficient documentation

## 2013-03-26 LAB — TSH: TSH: 0.11 u[IU]/mL — ABNORMAL LOW (ref 0.35–5.50)

## 2013-03-26 LAB — T4, FREE: Free T4: 1.29 ng/dL (ref 0.60–1.60)

## 2013-03-26 MED ORDER — LEVOTHYROXINE SODIUM 112 MCG PO TABS: 112.0000 ug | ORAL_TABLET | Freq: Every day | ORAL | Status: DC

## 2013-04-03 ENCOUNTER — Telehealth: Payer: Self-pay | Admitting: *Deleted

## 2013-04-03 ENCOUNTER — Encounter: Payer: Self-pay | Admitting: Internal Medicine

## 2013-04-03 NOTE — Telephone Encounter (Signed)
Sent pt message through MyCharT:  Dear Ms. Reuss,  I saw your phone call about your symptoms of heart racing, and the concern that the 112 mcg of levothyroxine might be still too high. Unfortunately, it is a little bit too soon to check on the efficacy of this dose, what I would suggest is to skip the next 3 days of levothyroxine and restart on Saturday on the 112 mcg and see if you feel better. I would wait at least 4 weeks since we changed the dose until we recheck the TSH. Please let me know if your symptoms do not get better or they return after you restart the levothyroxine, and in that case, we might need to try a medication called a beta blocker to help with your symptoms until we can recheck your TSH.  Please let me know if you have any questions.  Sincerely,  Carlus Pavlov MD

## 2013-04-03 NOTE — Telephone Encounter (Signed)
Pt called stating that she is feeling nervous, shakey and last night her heart was racing. She is concerned about the dosage of her levothyroxine. Should she continue at this new dosage level of ? [phone]) Please advise.

## 2013-04-04 ENCOUNTER — Encounter: Payer: Self-pay | Admitting: Internal Medicine

## 2013-04-07 ENCOUNTER — Encounter: Payer: Self-pay | Admitting: Internal Medicine

## 2013-04-09 MED ORDER — LEVOTHYROXINE SODIUM 100 MCG PO TABS: 100.0000 ug | ORAL_TABLET | Freq: Every day | ORAL | Status: DC

## 2013-04-18 ENCOUNTER — Encounter: Payer: Self-pay | Admitting: Internal Medicine

## 2013-04-18 DIAGNOSIS — R002 Palpitations: Secondary | ICD-10-CM

## 2013-04-18 MED ORDER — METOPROLOL TARTRATE 25 MG PO TABS: ORAL_TABLET | ORAL | Status: DC

## 2013-04-19 ENCOUNTER — Emergency Department (HOSPITAL_COMMUNITY): Payer: BC Managed Care – PPO

## 2013-04-19 ENCOUNTER — Emergency Department (HOSPITAL_COMMUNITY)
Admission: EM | Admit: 2013-04-19 | Discharge: 2013-04-19 | Disposition: A | Discharge status: Home / Self Care | Payer: BC Managed Care – PPO | Attending: Emergency Medicine | Admitting: Emergency Medicine

## 2013-04-19 ENCOUNTER — Encounter (HOSPITAL_COMMUNITY): Payer: Self-pay | Admitting: Emergency Medicine

## 2013-04-19 DIAGNOSIS — Z87891 Personal history of nicotine dependence: Secondary | ICD-10-CM | POA: Insufficient documentation

## 2013-04-19 DIAGNOSIS — E079 Disorder of thyroid, unspecified: Secondary | ICD-10-CM | POA: Insufficient documentation

## 2013-04-19 DIAGNOSIS — Z8659 Personal history of other mental and behavioral disorders: Secondary | ICD-10-CM | POA: Insufficient documentation

## 2013-04-19 DIAGNOSIS — Z880 Allergy status to penicillin: Secondary | ICD-10-CM | POA: Insufficient documentation

## 2013-04-19 DIAGNOSIS — Z79899 Other long term (current) drug therapy: Secondary | ICD-10-CM | POA: Insufficient documentation

## 2013-04-19 DIAGNOSIS — Z7982 Long term (current) use of aspirin: Secondary | ICD-10-CM | POA: Insufficient documentation

## 2013-04-19 DIAGNOSIS — R002 Palpitations: Secondary | ICD-10-CM | POA: Insufficient documentation

## 2013-04-19 DIAGNOSIS — R5381 Other malaise: Secondary | ICD-10-CM | POA: Insufficient documentation

## 2013-04-19 HISTORY — DX: Anxiety disorder, unspecified: F41.9

## 2013-04-19 LAB — POCT I-STAT TROPONIN I: Troponin i, poc: 0 ng/mL (ref 0.00–0.08)

## 2013-04-19 LAB — BASIC METABOLIC PANEL
CO2: 27 mEq/L (ref 19–32)
Chloride: 106 mEq/L (ref 96–112)
Creatinine, Ser: 1.11 mg/dL — ABNORMAL HIGH (ref 0.50–1.10)
GFR calc Af Amer: 59 mL/min — ABNORMAL LOW (ref >90)
Potassium: 3.8 mEq/L (ref 3.5–5.1)
Sodium: 143 mEq/L (ref 135–145)

## 2013-04-19 LAB — CBC
Platelets: 238 10*3/uL (ref 150–400)
RBC: 4.39 MIL/uL (ref 3.87–5.11)
RDW: 13.1 % (ref 11.5–15.5)
WBC: 8 10*3/uL (ref 4.0–10.5)

## 2013-04-19 MED ORDER — NITROGLYCERIN 0.4 MG SL SUBL: 0.4000 mg | SUBLINGUAL_TABLET | SUBLINGUAL | Status: DC | PRN

## 2013-04-19 MED ORDER — ASPIRIN 325 MG PO TABS
325.0000 mg | ORAL_TABLET | ORAL | Status: AC
  Administered 2013-04-19: 325 mg via ORAL
  Filled 2013-04-19: qty 1

## 2013-04-19 NOTE — ED Notes (Signed)
PT. REPORTS INTERMITTENT LEFT CHEST PAIN WITH PALPITATIONS RADIATING TO LEFT AXILLA THIS EVENING  , TOOK METROPOLOL 25 MG PO PTA. DENIES SOB OR COUGH . NO NAUSEA OR DIAPHORESIS. SCHEDULED FOR STRESS TEST ON MAY 27 , 2014 -CARDIOLOGIST DR. Alanda Amass.

## 2013-04-19 NOTE — ED Provider Notes (Signed)
History     CSN: [id number]  Arrival date & time 04/19/13  0013   First MD Initiated Contact with Patient 04/19/13 0134      Chief Complaint  Patient presents with  . Chest Pain    (Consider location/radiation/quality/duration/timing/severity/associated sxs/prior treatment) HPI Comments: Patient presents with complaints of weakness, palpitations that she has been experiencing for several weeks.  She tells me she had some adjustments in her thyroid pill recently and these symptoms seemed to start right around that.  She denies to me she is having any chest pain or shortness of breath.    Patient is a 67 y.o. female presenting with palpitations. The history is provided by the patient.  Palpitations  This is a new problem. Episode onset: one week ago. The problem has not changed since onset.The problem is associated with an unknown factor. Pertinent negatives include no diaphoresis, no fever, no chest pain and no nausea.    Past Medical History  Diagnosis Date  . Thyroid disease   . Anxiety     Past Surgical History  Procedure Laterality Date  . Tubal ligation      No family history on file.  History  Substance Use Topics  . Smoking status: Former Games developer  . Smokeless tobacco: Not on file     Comment: Quit in 2009  . Alcohol Use: No    OB History   Grav Para Term Preterm Abortions TAB SAB Ect Mult Living                  Review of Systems  Constitutional: Negative for fever and diaphoresis.  Cardiovascular: Positive for palpitations. Negative for chest pain.  Gastrointestinal: Negative for nausea.  All other systems reviewed and are negative.    Allergies  Penicillins  Home Medications   Current Outpatient Rx  Name  Route  Sig  Dispense  Refill  . aspirin 81 MG chewable tablet   Oral   Chew 81 mg by mouth daily.         Marland Kitchen b complex vitamins tablet   Oral   Take 1 tablet by mouth daily.         . Calcium Carbonate-Vitamin D (CALCIUM + D PO)  Oral   Take 1,000 mg by mouth daily.         Marland Kitchen co-enzyme Q-10 30 MG capsule   Oral   Take 30 mg by mouth 3 (three) times daily.         . fish oil-omega-3 fatty acids 1000 MG capsule   Oral   Take 1 g by mouth 3 (three) times daily.          Marland Kitchen levothyroxine (SYNTHROID, LEVOTHROID) 100 MCG tablet   Oral   Take 100 mcg by mouth daily.         . Magnesium 250 MG TABS   Oral   Take 250 mg by mouth daily.          . metoprolol tartrate (LOPRESSOR) 25 MG tablet      Take once a day at night           BP 91/36  Pulse 56  Temp(Src) 97.5 F (36.4 C) (Oral)  Resp 17  SpO2 96%  Physical Exam  Nursing note and vitals reviewed. Constitutional: She is oriented to person, place, and time. She appears well-developed and well-nourished. No distress.  HENT:  Head: Normocephalic and atraumatic.  Neck: Normal range of motion. Neck supple.  Cardiovascular: Normal rate  and regular rhythm.  Exam reveals no gallop and no friction rub.   No murmur heard. Pulmonary/Chest: Effort normal and breath sounds normal. No respiratory distress. She has no wheezes.  Abdominal: Soft. Bowel sounds are normal. She exhibits no distension. There is no tenderness.  Musculoskeletal: Normal range of motion.  Neurological: She is alert and oriented to person, place, and time.  Skin: Skin is warm and dry. She is not diaphoretic.    ED Course  Procedures (including critical care time)  Labs Reviewed  BASIC METABOLIC PANEL - Abnormal; Notable for the following:    Glucose, Bld 130 (*)    Creatinine, Ser 1.11 (*)    GFR calc non Af Amer 51 (*)    GFR calc Af Amer 59 (*)    All other components within normal limits  CBC  POCT I-STAT TROPONIN I   Dg Chest Port 1 View  04/19/2013  *RADIOLOGY REPORT*  Clinical Data: Chest pain.  Neck pain.  Shortness of breath.  PORTABLE CHEST - 1 VIEW  Comparison: 08/11/2007  Findings: Shallow inspiration.  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 The  heart size and pulmonary vascularity are normal. The lungs appear clear and expanded without focal air space disease or consolidation. No blunting of the costophrenic angles. No pneumothorax.  Mediastinal contours appear intact. Calcification of the aorta.  IMPRESSION: No evidence of active pulmonary disease.   Original Report Authenticated By: Burman Nieves, M.D.      1. Palpitations     Date: 04/19/2013  Rate: 90  Rhythm: normal sinus rhythm with frequent pvcs.  QRS Axis: normal  Intervals: normal  ST/T Wave abnormalities: normal  Conduction Disutrbances:none  Narrative Interpretation:   Old EKG Reviewed: unchanged     MDM  The patient presents with increased palpitations since increasing her thyroid medications.  The workup today reveals pvcs, but evidence for emergent pathology.  The troponin was negative and the ekg was otherwise unremarkable.  No further workup is indicated at this point.  I have recommended she up her metoprolol to 25 mg twice daily and follow up with her pcp in the next 2-3 days.        Geoffery Lyons, MD 04/19/13 [phone]

## 2013-05-23 ENCOUNTER — Encounter: Payer: Self-pay | Admitting: Internal Medicine

## 2013-05-23 DIAGNOSIS — E039 Hypothyroidism, unspecified: Secondary | ICD-10-CM

## 2013-05-29 ENCOUNTER — Ambulatory Visit (INDEPENDENT_AMBULATORY_CARE_PROVIDER_SITE_OTHER): Payer: BC Managed Care – PPO | Admitting: Internal Medicine

## 2013-05-29 ENCOUNTER — Encounter: Payer: Self-pay | Admitting: Internal Medicine

## 2013-05-29 VITALS — BP 126/82 | HR 99 | Temp 98.3°F | Resp 12 | Ht 60.5 in | Wt 163.0 lb

## 2013-05-29 DIAGNOSIS — E039 Hypothyroidism, unspecified: Secondary | ICD-10-CM

## 2013-05-29 NOTE — Progress Notes (Signed)
Subjective:     Patient ID: Ana Gutierrez, female   DOB: 12-05-1946, 67 y.o.   MRN: [id number]  HPI Ana Gutierrez is a 67 year old woman, self-referred for management of hypothyroidism, dx 2000. She has heard of our office from Dr. Evlyn Kanner. Last visit 2 mo ago.  She was dx with hypothyroidism ~2000 and has been maintained for a long time on a dose of levothyroxine 100 mcg daily. Several months ago she was started on Zetia for her hyperlipidemia since she could not tolerate statins. On 01/15/2013, her TSH returned at 9.359. Her levothyroxine dose was increased to 125 mcg daily. She also stopped Zetia around the same time. Last TSH, after stopping Zetia: Lab Results  Component Value Date   TSH 0.11* 03/23/2013  At that time, we decreased her Synthroid dose to 112 mcg daily. She developed palpitations and we decreased her dose to 100 mcg daily. She started this on 04/18/2013, however she went to the emergency room on 04/19/2013 for palpitations. She was scheduled appointment with Dr. Alanda Amass with cardiology and she will have a stress test on 06/11/2013. However, her palpitations stopped after decreasing the levothyroxine dose to 100 mcg daily.  She takes Levothyroxine early in am with coffee. She eats 1.5 hours later. She takes her calcium at night.   She goes to [employer], which last an hour. She walks 1 mile daily - 20 min. She had no more weight gain, but still has some fatigue and increased appetite. She continues to have muscle aches. No tremors, anxiety, diarrhea, insomnia.  I reviewed pt's medications, allergies, PMH, social hx, family hx and no changes required, except as mentioned above.  Review of Systems Constitutional: has weight gain, has fatigue, hot flashes, has increased appetite Eyes: no blurry vision, no xerophthalmia ENT: no sore throat, no nodules palpated in throat, no dysphagia/odynophagia, no hoarseness Cardiovascular: no CP/SOB/palpitations/leg swelling Respiratory:  no cough/SOB Gastrointestinal: no N/V/D/C Musculoskeletal: plus muscle aches/no joint aches Skin: no rashes, + itching Neurological: no tremors/numbness/tingling/dizziness Psychiatric: no depression/anxiety Low libido  Objective:   Physical Exam BP 126/82  Pulse 99  Temp(Src) 98.3 F (36.8 C) (Oral)  Resp 12  Ht 5' 0.5" (1.537 m)  Wt 163 lb (73.936 kg)  BMI 31.3 kg/m2  SpO2 96% Wt Readings from Last 3 Encounters:  05/29/13 163 lb (73.936 kg)  03/23/13 162 lb (73.483 kg)  02/14/12 159 lb (72.122 kg)   Constitutional: overweight - truncal obesity, in NAD Eyes: PERRLA, EOMI, no exophthalmos ENT: moist mucous membranes, no thyromegaly, no cervical lymphadenopathy Cardiovascular: RRR, No MRG Respiratory: CTA B Gastrointestinal: abdomen soft, NT, ND, BS+ Musculoskeletal: no deformities, strength intact in all 4 Skin: moist, warm, no rashes Neurological: very mild tremor with outstretched hands, DTR normal in all 4  Assessment:     1. Hypothyroidism   2. Hyperlipidemia  - stopped Zetia     Plan:     1. Hypothyroidism Patient has long-standing hypothyroidism, previously well controlled on a stable dose of 100 mcg levothyroxine daily, however, with a TSH value that was 9 while on Zetia. After this, her levothyroxinedose was increased to 125 and she was taken off Zetia, with subsequent decrease of TSH to 0.11. At that point, we decreased her levothyroxine dose to 112, but since she was having palpitations, we switched her back to 100 mcg daily starting on 04/18/2013. She has not been having palpitations since she started the new dose. She is overall feeling well and feels that her thyroid does is  probably correct now. - I will check her TSH and free T4 today and let her know the results through my chart - I will see her back in 6 months  2. Hyperlipidemia - The patient asked for my input regarding this problem and I reviewed her recent cholesterol levels from 01/15/2013, when  the LDL was 117, HDL was 37, and triglycerides were 137. LFTs were normal. - I recommended that she retries a statin, she has been on Lipitor in the past, but she did not tolerate it very well due to muscle pain. She was on coenzyme Q at that time. I advised her that she can take a statin every other night, or, if she starts Crestor, she can even take it once a week and still benefit from it. - she will first have a repeat lipid panel with PCP, fasting  Office Visit on 05/29/2013  Component Date Value Range Status  . TSH 05/29/2013 0.22* 0.35 - 5.50 uIU/mL Final  . Free T4 05/29/2013 1.22  0.60 - 1.60 ng/dL Final   Will decrease Levothyroxine dose even further, to 88 mcg daily. Return for labs in 6-8 weeks. MyChart msg sent.

## 2013-05-29 NOTE — Patient Instructions (Signed)
Please return in 6 months.  

## 2013-05-30 LAB — T4, FREE: Free T4: 1.22 ng/dL (ref 0.60–1.60)

## 2013-05-31 MED ORDER — LEVOTHYROXINE SODIUM 88 MCG PO TABS: ORAL_TABLET | ORAL | Status: DC

## 2013-06-11 ENCOUNTER — Encounter (INDEPENDENT_AMBULATORY_CARE_PROVIDER_SITE_OTHER): Payer: BC Managed Care – PPO

## 2013-06-11 DIAGNOSIS — R0602 Shortness of breath: Secondary | ICD-10-CM

## 2013-06-11 LAB — PULMONARY FUNCTION TEST

## 2013-06-19 ENCOUNTER — Encounter: Payer: Self-pay | Admitting: Cardiovascular Disease

## 2013-07-25 ENCOUNTER — Other Ambulatory Visit: Payer: Self-pay

## 2013-08-13 ENCOUNTER — Ambulatory Visit: Payer: BC Managed Care – PPO | Admitting: Internal Medicine

## 2013-08-24 ENCOUNTER — Encounter: Payer: Self-pay | Admitting: Internal Medicine

## 2013-08-24 ENCOUNTER — Ambulatory Visit (INDEPENDENT_AMBULATORY_CARE_PROVIDER_SITE_OTHER): Payer: 59 | Admitting: Internal Medicine

## 2013-08-24 VITALS — BP 110/62 | HR 85 | Temp 98.3°F | Resp 12 | Wt 163.8 lb

## 2013-08-24 DIAGNOSIS — E039 Hypothyroidism, unspecified: Secondary | ICD-10-CM

## 2013-08-24 LAB — TSH: TSH: 1.76 u[IU]/mL (ref 0.35–5.50)

## 2013-08-24 NOTE — Patient Instructions (Addendum)
Please return in 6 months.  You may need to come back for labs in 2 months.

## 2013-08-24 NOTE — Progress Notes (Signed)
Subjective:     Patient ID: Ana Gutierrez, female   DOB: [birth year], 67 y.o.   MRN: [id number]  HPI Ana Gutierrez is a 67 year old woman, self-referred for management of hypothyroidism, dx 2000. Last visit 3 mo ago.  She has been maintained for a long time on a dose of levothyroxine 100 mcg daily but several mo ago she was started on Zetia for her hyperlipidemia since she could not tolerate statins. On 01/15/2013, her TSH returned at 9.359. Her levothyroxine dose was increased to 125 mcg daily, but then she stopped Zetia >> became hyperthyroid (overreplaced). At that time, we decreased her Synthroid dose to 112 mcg daily. She developed palpitations and we decreased her dose further to 100 mcg daily on 04/18/2013, however she went to the emergency room on 04/19/2013 for palpitations. Her palpitations stopped after decreasing the levothyroxine dose to 100 mcg daily.  Reviewed TSH levels: Lab Results  Component Value Date   TSH 0.22* 05/29/2013   TSH 0.11* 03/23/2013  after her TSH returned at 0.22, I decreased her levothyroxine dose further to 88 mcg daily.  She swallows Levothyroxine early in am without water and has coffee 30 minutes later. She eats 1.5 hours later. She takes her calcium at night.   She goes to [employer], which last an hour - 3 times a week She walks 1 mile daily - 20 min. She had no more weight gain, she tells that she would like to lose some more weight, especially from the abdomen. She continues to have muscle aches, mostly after starting Crestor. She tells me that she has days in which she does not take it because of muscle pain. No tremors, anxiety, diarrhea.  I reviewed pt's medications, allergies, PMH, social hx, family hx and no changes required, except as mentioned above. She started Crestor 5 mg which he takes most days of the week.  Review of Systems Constitutional: no weight gain, has fatigue, has increased appetite Eyes: no blurry vision, no  xerophthalmia ENT: no sore throat, no nodules palpated in throat, no dysphagia/odynophagia, no hoarseness Cardiovascular: no CP/SOB/palpitations/leg swelling Respiratory: no cough/SOB Gastrointestinal: no N/V/D/C Musculoskeletal: plus muscle aches/no joint aches Skin: no rashes, + itching Neurological: no tremors/numbness/tingling/dizziness  Objective:   Physical Exam BP 110/62  Pulse 85  Temp(Src) 98.3 F (36.8 C) (Oral)  Resp 12  Wt 163 lb 12.8 oz (74.299 kg)  BMI 31.45 kg/m2  SpO2 97% Wt Readings from Last 3 Encounters:  08/24/13 163 lb 12.8 oz (74.299 kg)  05/29/13 163 lb (73.936 kg)  03/23/13 162 lb (73.483 kg)   Constitutional: overweight - truncal obesity, in NAD Eyes: PERRLA, EOMI, no exophthalmos ENT: moist mucous membranes, no thyromegaly, no cervical lymphadenopathy Cardiovascular: RRR, No MRG Respiratory: CTA B Musculoskeletal: no deformities, strength intact in all 4 Skin: moist, warm, no rashes Neurological: no tremor with outstretched hands, DTR normal in all 4  Assessment:     1. Hypothyroidism   2. Hyperlipidemia  01/15/2013: LDL was 117, HDL was 37, and triglycerides were 137. LFTs were normal - stopped Zetia  - started Crestor 5 - most days of the week - on coenzyme Q    Plan:     1. Hypothyroidism Patient has long-standing hypothyroidism, previously well controlled on a stable dose of 100 mcg levothyroxine daily, however, with a TSH value that was 9 while on Zetia. After this, her levothyroxinedose was increased to 125 and she was taken off Zetia, with subsequent decrease of TSH to 0.11. At  that point, we decreased her levothyroxine dose to 112, but since she was having palpitations, we switched her back to 100 mcg daily starting on 04/18/2013. In 05/2013, her TSH was still suppressed, and 0.22, so we decreased her dose to 88 mcg daily. She continues on this dose now. - I will check her TSH and free T4 today and let her know the results through my  chart - we need to refill 90 days supply - [facility] - I will see her back in 6 months  2. Hyperlipidemia - at last visit, I recommended that she retries a statin, she has been on Lipitor in the past, but she did not tolerate it very well due to muscle pain. She was on coenzyme Q at that time. I advised her that she can take a statin every other night, or, if she starts Crestor, she can even take it once a week and still benefit from it. She does take it now several times a week, along with coenzyme Q. She will have a repeat lipid panel with her cardiologist  Office Visit on 08/24/2013  Component Date Value Range Status  . TSH 08/24/2013 1.76  0.35 - 5.50 uIU/mL Final  . Free T4 08/24/2013 0.92  0.60 - 1.60 ng/dL Final   Normal TFTs - will call in refills. RTC in 6 mo.

## 2013-08-25 MED ORDER — LEVOTHYROXINE SODIUM 88 MCG PO TABS: ORAL_TABLET | ORAL | Status: DC

## 2013-10-25 ENCOUNTER — Other Ambulatory Visit: Payer: Self-pay

## 2013-12-04 ENCOUNTER — Ambulatory Visit (INDEPENDENT_AMBULATORY_CARE_PROVIDER_SITE_OTHER): Payer: 59 | Admitting: Internal Medicine

## 2013-12-04 ENCOUNTER — Encounter: Payer: Self-pay | Admitting: Internal Medicine

## 2013-12-04 VITALS — BP 112/68 | HR 95 | Temp 98.4°F | Resp 10 | Wt 163.0 lb

## 2013-12-04 DIAGNOSIS — E039 Hypothyroidism, unspecified: Secondary | ICD-10-CM

## 2013-12-04 NOTE — Progress Notes (Signed)
Subjective:     Patient ID: Ana Gutierrez, female   DOB: 12/16/1946, 67 y.o.   MRN: [id number]  HPI Ana Gutierrez is a 67 y.o. woman, returning for f/u for hypothyroidism, dx 2000. Last visit 3 mo ago.  She has been maintained for a long time on a dose of levothyroxine 100 mcg daily but several mo ago she was started on Zetia for her hyperlipidemia since she could not tolerate statins. On 01/15/2013, her TSH returned at 9.359. Her levothyroxine dose was increased to 125 mcg daily, but then she stopped Zetia >> became hyperthyroid (overreplaced). At that time, we decreased her Synthroid dose to 112 mcg daily. She developed palpitations and we decreased her dose further to 100 mcg daily on 04/18/2013, however she went to the emergency room on 04/19/2013 for palpitations. Her palpitations stopped after decreasing the levothyroxine dose to 100 mcg daily. However, a TSH returned at 0.22, so we decreased her levothyroxine dose further to 88 mcg daily.  Reviewed TSH levels: Lab Results  Component Value Date   TSH 1.76 08/24/2013   TSH 0.22* 05/29/2013   TSH 0.11* 03/23/2013  . She swallows Levothyroxine early in am without water and has coffee 30 minutes later. She eats 1.5 hours later. She takes her calcium at night. She restarted Zetia - at night 1-2 weeks ago.  She had an exercise test and this was normal this summer (per her report).  She goes to [employer], which last an hour - 3 times a week. No tremors, anxiety, diarrhea.  I reviewed pt's medications, allergies, PMH, social hx, family hx and no changes required.   Review of Systems Constitutional: no weight gain, + fatigue, has increased appetite Eyes: no blurry vision, no xerophthalmia ENT: no sore throat, no nodules palpated in throat, no dysphagia/odynophagia, no hoarseness Cardiovascular: no CP/SOB/palpitations/leg swelling Respiratory: no cough/SOB Gastrointestinal: no N/V/D/C Musculoskeletal: no muscle aches/+ joint aches  (shoulder) Skin: no rashes Neurological: no tremors/numbness/tingling/dizziness  Objective:   Physical Exam BP 112/68  Pulse 95  Temp(Src) 98.4 F (36.9 C) (Oral)  Resp 10  Wt 163 lb (73.936 kg)  SpO2 96% Wt Readings from Last 3 Encounters:  12/04/13 163 lb (73.936 kg)  08/24/13 163 lb 12.8 oz (74.299 kg)  05/29/13 163 lb (73.936 kg)   Constitutional: overweight - truncal obesity, in NAD Eyes: PERRLA, EOMI, no exophthalmos ENT: moist mucous membranes, no thyromegaly, no cervical lymphadenopathy Cardiovascular: RRR, No MRG Respiratory: CTA B Musculoskeletal: no deformities, strength intact in all 4 Skin: moist, warm, no rashes Neurological: no tremor with outstretched hands, DTR normal in all 4  Assessment:     1. Hypothyroidism   2. Hyperlipidemia  - back on Zetia  - on coenzyme Q    Plan:     1. Hypothyroidism Patient has long-standing hypothyroidism, previously well controlled on a stable dose of 100 mcg levothyroxine daily, however, with a TSH value that was 9 while on Zetia. After this, her levothyroxinedose was increased to 125 and she was taken off Zetia, with subsequent decrease of TSH to 0.11. At that point, we decreased her levothyroxine dose to 112, but since she was having palpitations, we switched her back to 100 mcg daily starting on 04/18/2013. In 05/2013, her TSH was still suppressed, and 0.22, so we decreased her dose to 88 mcg daily. She continues on this dose now. No palpitations or other thyrotoxic sxs. - I will check her TSH and free T4 today and let her know the results through  my chart - since she restarted Zetia, will ask her to come back in 2 mo for repeat TFTs - I will see her back in 6 months  2. Hyperlipidemia - could not tolerate statins, now back on Zetia. Also takes coenzyme Q. She will have a repeat lipid panel with her cardiologist  Office Visit on 12/04/2013  Component Date Value Range Status  . TSH 12/04/2013 9.59* 0.35 - 5.50 uIU/mL  Final  . Free T4 12/04/2013 0.85  0.60 - 1.60 ng/dL Final   I will advise to alternate 100 and 88 mcg every other day. Will check labs at next visit.

## 2013-12-04 NOTE — Patient Instructions (Signed)
Please stop at the lab.  Please come back for a follow-up appointment in 6 months.  

## 2013-12-05 LAB — T4, FREE: Free T4: 0.85 ng/dL (ref 0.60–1.60)

## 2013-12-07 ENCOUNTER — Encounter: Payer: Self-pay | Admitting: Internal Medicine

## 2013-12-07 MED ORDER — LEVOTHYROXINE SODIUM 100 MCG PO TABS: ORAL_TABLET | ORAL | Status: DC

## 2013-12-07 MED ORDER — LEVOTHYROXINE SODIUM 88 MCG PO TABS: ORAL_TABLET | ORAL | Status: DC

## 2014-03-20 ENCOUNTER — Other Ambulatory Visit (HOSPITAL_COMMUNITY): Payer: Self-pay | Admitting: Family Medicine

## 2014-03-20 ENCOUNTER — Ambulatory Visit (HOSPITAL_COMMUNITY)
Admission: RE | Admit: 2014-03-20 | Discharge: 2014-03-20 | Disposition: A | Discharge status: Home / Self Care | Payer: No Typology Code available for payment source | Source: Ambulatory Visit | Attending: Family Medicine | Admitting: Family Medicine

## 2014-03-20 ENCOUNTER — Encounter (HOSPITAL_COMMUNITY): Payer: Self-pay

## 2014-03-20 DIAGNOSIS — K7689 Other specified diseases of liver: Secondary | ICD-10-CM | POA: Diagnosis not present

## 2014-03-20 DIAGNOSIS — R1032 Left lower quadrant pain: Secondary | ICD-10-CM

## 2014-03-20 DIAGNOSIS — E278 Other specified disorders of adrenal gland: Secondary | ICD-10-CM | POA: Diagnosis not present

## 2014-03-20 MED ORDER — IOHEXOL 300 MG/ML  SOLN
100.0000 mL | Freq: Once | INTRAMUSCULAR | Status: AC | PRN
  Administered 2014-03-20: 100 mL via INTRAVENOUS

## 2014-03-25 ENCOUNTER — Other Ambulatory Visit (HOSPITAL_COMMUNITY): Payer: Self-pay | Admitting: Family Medicine

## 2014-03-25 DIAGNOSIS — E279 Disorder of adrenal gland, unspecified: Secondary | ICD-10-CM

## 2014-03-25 DIAGNOSIS — E278 Other specified disorders of adrenal gland: Secondary | ICD-10-CM

## 2014-03-27 ENCOUNTER — Ambulatory Visit (HOSPITAL_COMMUNITY)
Admission: RE | Admit: 2014-03-27 | Discharge: 2014-03-27 | Disposition: A | Discharge status: Home / Self Care | Payer: 59 | Source: Ambulatory Visit | Attending: Family Medicine | Admitting: Family Medicine

## 2014-03-27 DIAGNOSIS — R109 Unspecified abdominal pain: Secondary | ICD-10-CM | POA: Insufficient documentation

## 2014-03-27 DIAGNOSIS — E278 Other specified disorders of adrenal gland: Secondary | ICD-10-CM | POA: Diagnosis present

## 2014-03-27 DIAGNOSIS — N949 Unspecified condition associated with female genital organs and menstrual cycle: Secondary | ICD-10-CM | POA: Insufficient documentation

## 2014-03-27 DIAGNOSIS — E279 Disorder of adrenal gland, unspecified: Secondary | ICD-10-CM

## 2014-03-27 MED ORDER — GADOBENATE DIMEGLUMINE 529 MG/ML IV SOLN
15.0000 mL | Freq: Once | INTRAVENOUS | Status: AC | PRN
  Administered 2014-03-27: 15 mL via INTRAVENOUS

## 2014-03-27 MED ORDER — SODIUM CHLORIDE 0.9 % IV SOLN
INTRAVENOUS | Status: AC
  Filled 2014-03-27: qty 250

## 2014-05-22 HISTORY — PX: ADRENALECTOMY: SHX876

## 2014-10-11 ENCOUNTER — Other Ambulatory Visit (HOSPITAL_COMMUNITY): Payer: Self-pay | Admitting: Internal Medicine

## 2014-10-11 DIAGNOSIS — R1010 Upper abdominal pain, unspecified: Secondary | ICD-10-CM

## 2014-10-15 ENCOUNTER — Ambulatory Visit (HOSPITAL_COMMUNITY): Payer: 59

## 2014-10-16 ENCOUNTER — Ambulatory Visit (HOSPITAL_COMMUNITY)
Admission: RE | Admit: 2014-10-16 | Discharge: 2014-10-16 | Disposition: A | Discharge status: Home / Self Care | Payer: 59 | Source: Ambulatory Visit | Attending: Internal Medicine | Admitting: Internal Medicine

## 2014-10-16 DIAGNOSIS — R1011 Right upper quadrant pain: Secondary | ICD-10-CM | POA: Diagnosis present

## 2014-10-16 DIAGNOSIS — R1010 Upper abdominal pain, unspecified: Secondary | ICD-10-CM

## 2014-10-21 ENCOUNTER — Ambulatory Visit (HOSPITAL_COMMUNITY): Payer: Medicare Other

## 2014-12-29 DIAGNOSIS — J069 Acute upper respiratory infection, unspecified: Secondary | ICD-10-CM | POA: Diagnosis not present

## 2015-01-23 ENCOUNTER — Encounter: Payer: Self-pay | Admitting: Gastroenterology

## 2015-01-28 ENCOUNTER — Ambulatory Visit (INDEPENDENT_AMBULATORY_CARE_PROVIDER_SITE_OTHER): Payer: 59 | Admitting: Internal Medicine

## 2015-01-28 ENCOUNTER — Other Ambulatory Visit (INDEPENDENT_AMBULATORY_CARE_PROVIDER_SITE_OTHER): Payer: 59

## 2015-01-28 ENCOUNTER — Encounter: Payer: Self-pay | Admitting: Internal Medicine

## 2015-01-28 VITALS — BP 136/80 | HR 84 | Ht 60.5 in | Wt 157.1 lb

## 2015-01-28 DIAGNOSIS — R748 Abnormal levels of other serum enzymes: Secondary | ICD-10-CM | POA: Diagnosis not present

## 2015-01-28 DIAGNOSIS — K760 Fatty (change of) liver, not elsewhere classified: Secondary | ICD-10-CM

## 2015-01-28 DIAGNOSIS — R1011 Right upper quadrant pain: Secondary | ICD-10-CM

## 2015-01-28 LAB — HEPATIC FUNCTION PANEL
ALK PHOS: 67 U/L (ref 39–117)
ALT: 21 U/L (ref 0–35)
AST: 19 U/L (ref 0–37)
Albumin: 4.5 g/dL (ref 3.5–5.2)
BILIRUBIN DIRECT: 0.1 mg/dL (ref 0.0–0.3)
BILIRUBIN TOTAL: 0.3 mg/dL (ref 0.2–1.2)
Total Protein: 7.5 g/dL (ref 6.0–8.3)

## 2015-01-28 LAB — AMYLASE: Amylase: 38 U/L (ref 27–131)

## 2015-01-28 LAB — LIPASE: Lipase: 95 U/L — ABNORMAL HIGH (ref 11.0–59.0)

## 2015-01-28 NOTE — Patient Instructions (Signed)
Your physician has requested that you go to the basement for the following lab work before leaving today: Amylase, Lipase, LFT's  Today you have been given a handout to read on fatty liver.   I appreciate the opportunity to care for you. Silvano Rusk, M.D., [facility]

## 2015-01-28 NOTE — Progress Notes (Signed)
Referred by Dr. Orland Penman at [facility] in clinic Subjective:   Scribed by Ana Adie, PA-S,    Patient ID: Ana Gutierrez, female    DOB: 1946-11-10, 69 y.o.   MRN: [id number]   Cc: RUQ pain and elevated lipase HPI   69 y/o white female was referred for RUQ pain and elevated lipase/amylase.  Ultrasound was done which showed hepatic steatosis but no other abnormalities.  Patient states that she had right adrenal gland removed in June, 2015 and in October started having RUQ pain that was worse with eating certain types of foods (fatty and greasy).  She changed her diet up a bit and eats more vegetables/fruits and grilled foods and that has helped a lot.  States she has improved significantly since October but still has pain in RUQ on occasions when she eats something greasy ("fried chicken").  Denied N/V/D, dysphagia, bowel changes, blood in stool.  Denied family history of gallbladder, pancrease, or liver disease.    Lipase checked on 12/15 was at 114 and then rechecked on 12/21 showed a decrease to 68.  - top NL 59  She admits to being under stress with sister's death in 2014/12/02.    Allergies  Allergen Reactions  . Crestor [Rosuvastatin] Other (See Comments)    aches  . Metoprolol Tartrate Other (See Comments)    Dry mouth   . Penicillins Other (See Comments)    Gives her really bad yeast infections   Outpatient Prescriptions Prior to Visit  Medication Sig Dispense Refill  . aspirin 81 MG chewable tablet Chew 81 mg by mouth as needed.     Marland Kitchen b complex vitamins tablet Take 1 tablet by mouth daily.    . Calcium Carbonate-Vitamin D (CALCIUM + D PO) Take 1,000 mg by mouth daily.    Marland Kitchen co-enzyme Q-10 30 MG capsule Take 30 mg by mouth 3 (three) times daily.    Marland Kitchen ezetimibe (ZETIA) 10 MG tablet Take 10 mg by mouth daily.    . fish oil-omega-3 fatty acids 1000 MG capsule Take 1 g by mouth 3 (three) times daily.     . Magnesium 250 MG TABS Take 250 mg by mouth as needed.     .  CRESTOR 5 MG tablet     . levothyroxine (SYNTHROID, LEVOTHROID) 100 MCG tablet Take every other day, alternating with 88 mcg 30 tablet 3  . levothyroxine (SYNTHROID, LEVOTHROID) 88 MCG tablet Take every other day, alternating with 100 mcg 30 tablet 3  . metoprolol tartrate (LOPRESSOR) 25 MG tablet Take once a day at night     No facility-administered medications prior to visit.   Past Medical History  Diagnosis Date  . Thyroid disease   . Anxiety   . Hyperlipidemia    Past Surgical History  Procedure Laterality Date  . Tubal ligation    . Adrenalectomy Right 05/22/14  . Esophagogastroduodenoscopy    . Colonoscopy    . Bravo ph study  2011    negative   History   Social History  . Marital Status: Married    Spouse Name: N/A    Number of Children: 2  . Years of Education: N/A   Occupational History  . work force    Social History Main Topics  . Smoking status: Former Smoker    Types: Cigarettes    Quit date: 12/21/2007  . Smokeless tobacco: Never Used     Comment: Quit in 2009  . Alcohol Use: No  . Drug  Use: No  . Sexual Activity: None   Other Topics Concern  . None   Social History Narrative   Family History  Problem Relation Age of Onset  . Diabetes Mother   . Heart disease Mother   . Diabetes Maternal Aunt   . Colon cancer Maternal Grandfather        Review of Systems  HENT: Negative for trouble swallowing.   Respiratory: Negative for cough and chest tightness.   Gastrointestinal: Positive for abdominal pain. Negative for nausea, vomiting, diarrhea, constipation, blood in stool, abdominal distention and anal bleeding.  Genitourinary: Negative for dysuria and difficulty urinating.   All other ROS negative or as per HPI     Objective:   Physical Exam  General: Well developed, well nourished, appears no apparent distress. HEENT: Anicteic Sclera. No pharyngeal erythema or exudates  Neck: Supple, no JVD, no masses  Cardiovascular: RRR, S1 S2  auscultated, no rubs, murmurs or gallops.  Respiratory: Clear to auscultation bilaterally with equal chest rise  Abdomen: No tenderness to palpation, Neg Murphy, + BS, no masses.; No guarding or peritoneal signs Extremities: warm dry without cyanosis clubbing.  Neuro: AAOx3, cranial nerves grossly intact.  Skin: Without rashes exudates or nodules.  Psych: Normal affect and demeanor with intact judgement and insight      Assessment & Plan:   RUQ pain  Abnormal serum lipase level  Fatty liver  -Plan is to recheck: Amylase, Hepatic function panel, Lipase -HIDA done 01/2010 showed normal ejection fraction. -Will treat conservatively dependent on lab results, with observation/watchful waiting.  Scribed by Ana Adie, PA-S on 01/28/2015.  I saw the patient with Mr. Mirian Mo and personally performed HPI and PE also.  A/P as above  Also - note she has had these problems off and on with negative CCK HIDA and US's in past - with fatty liver only abnormality. CT abd/pelvis and MR abd this past year also without any new problems other than fatty liver in RUG.  Gatha Mayer, MD, [facility]   Cc: Delphina Cahill, MD and Dr. Orland Penman

## 2015-01-30 NOTE — Progress Notes (Signed)
Quick Note:  Labs show persistent mild elevation of the lipase Amylase and LFT's ok At this point cannot say this represents a problem - lipase usually from pancreas - we know that was ok on CT and MRI in past year - may also come from intestine  No more testing right now  1) lipase again in 6 weeks 2) REV me 2 months (about) please ______

## 2015-01-31 ENCOUNTER — Other Ambulatory Visit: Payer: Self-pay

## 2015-01-31 DIAGNOSIS — R748 Abnormal levels of other serum enzymes: Secondary | ICD-10-CM

## 2015-02-06 DIAGNOSIS — E279 Disorder of adrenal gland, unspecified: Secondary | ICD-10-CM | POA: Diagnosis not present

## 2015-02-06 DIAGNOSIS — K858 Other acute pancreatitis: Secondary | ICD-10-CM | POA: Diagnosis not present

## 2015-03-18 ENCOUNTER — Telehealth: Payer: Self-pay

## 2015-03-18 NOTE — Telephone Encounter (Signed)
-----   Message from Kellie Moor, RN sent at 01/31/2015  3:01 PM EST ----- Needs labs see results 01/31/15- Ana Gutierrez

## 2015-03-18 NOTE — Telephone Encounter (Signed)
Pt notified to have labs 

## 2015-03-28 ENCOUNTER — Other Ambulatory Visit (INDEPENDENT_AMBULATORY_CARE_PROVIDER_SITE_OTHER): Payer: 59

## 2015-03-28 DIAGNOSIS — R748 Abnormal levels of other serum enzymes: Secondary | ICD-10-CM | POA: Diagnosis not present

## 2015-03-28 LAB — LIPASE: Lipase: 189 U/L — ABNORMAL HIGH (ref 11.0–59.0)

## 2015-03-28 NOTE — Progress Notes (Signed)
Quick Note:  Lipase still elevated for unclear reason How is she?  ______

## 2015-03-31 NOTE — Progress Notes (Signed)
Quick Note:  Glad to hear she is ok I will discuss more at 4/18 visit - if she were having sxs was thinking of other xrays but will not since she is ok  Some people have lipase molecules that are large and do not clear the blood stream normally - that is one possibility  I do not think it is "dangerous" but I cannot explain why it is elevated.  To date we have not seen anything "bad" on xrays ______

## 2015-04-07 ENCOUNTER — Encounter: Payer: Self-pay | Admitting: Internal Medicine

## 2015-04-07 ENCOUNTER — Ambulatory Visit (INDEPENDENT_AMBULATORY_CARE_PROVIDER_SITE_OTHER): Payer: 59 | Admitting: Internal Medicine

## 2015-04-07 VITALS — BP 138/72 | HR 76 | Ht 60.5 in | Wt 162.1 lb

## 2015-04-07 DIAGNOSIS — R748 Abnormal levels of other serum enzymes: Secondary | ICD-10-CM

## 2015-04-07 DIAGNOSIS — R1011 Right upper quadrant pain: Secondary | ICD-10-CM

## 2015-04-07 NOTE — Patient Instructions (Signed)
Your physician has requested that you go to the basement for lab work before leaving today. Call in 2 weeks to update on your condition. Hold your Zetia for now.

## 2015-04-07 NOTE — Progress Notes (Signed)
   Subjective:    Patient ID: Ana Gutierrez, female    DOB: 03-May-1946, 69 y.o.   MRN: [id number] He complaint: Elevated lipase HPI The patient is here for follow-up. She has a persistently elevated lipase. I have followed that some over time. Last year she had CT scan of the abdomen and pelvis and MRI of the abdomen and pelvis that revealed an adrenal adenoma which was resected. Ultrasound in October was unrevealing though she does have fatty liver. Her lipase has not normalized. Amylase has been normal. She is complaining of some right upper quadrant tenderness and pain. She continually or at least intermittently checks the site where the tenderness is by pressing on it. She's been nervous and says the pain came back error since I called her telling her lipase was up again or still. She does have some GERD symptomatology, she was able to wean herself off of PPI number of years ago and now either uses antacids or PPI intermittently. There is no unintentional weight loss.  Medications, allergies, past medical history, past surgical history, family history and social history are reviewed and updated in the EMR.  Review of Systems As per history of present illness    Objective:   Physical Exam BP 138/72 mmHg  Pulse 76  Ht 5' 0.5" (1.537 m)  Wt 162 lb 2 oz (73.539 kg)  BMI 31.13 kg/m2 Obese abdomen, he is tender at the right mid inferior ribs. No hepatosplenomegaly or mass or other abdominal tenderness.  Lab Results  Component Value Date   LIPASE 189.0* 03/28/2015      Assessment & Plan:   1. Elevated lipase   2. RUQ pain    I'm not convinced these 2 things are related. Zetia could be causing the elevated lipase. It can cause pancreatitis. I'm going to have her hold that and recheck an amylase and lipase in a month. If she has persistent problems even just 2 weeks from now we may go a CT of the abdomen. My overall index of suspicion of problems is low but she has these persistent  abnormalities that we haven't explained and pancreatic neoplasia malignancy is always in the differential with this though nothing showed up last year on MRI and CT. If CT is unrevealing then an upper endoscopy could make sense.  15 minutes time spent with patient > half in counseling coordination of care  I appreciate the opportunity to care for this patient. CC: Delphina Cahill, MD

## 2015-04-25 DIAGNOSIS — E039 Hypothyroidism, unspecified: Secondary | ICD-10-CM | POA: Diagnosis not present

## 2015-04-25 DIAGNOSIS — E782 Mixed hyperlipidemia: Secondary | ICD-10-CM | POA: Diagnosis not present

## 2015-04-25 DIAGNOSIS — E119 Type 2 diabetes mellitus without complications: Secondary | ICD-10-CM | POA: Diagnosis not present

## 2015-05-02 DIAGNOSIS — E784 Other hyperlipidemia: Secondary | ICD-10-CM | POA: Diagnosis not present

## 2015-05-02 DIAGNOSIS — E039 Hypothyroidism, unspecified: Secondary | ICD-10-CM | POA: Diagnosis not present

## 2015-05-02 DIAGNOSIS — R7301 Impaired fasting glucose: Secondary | ICD-10-CM | POA: Diagnosis not present

## 2015-05-13 ENCOUNTER — Telehealth: Payer: Self-pay | Admitting: Internal Medicine

## 2015-05-13 DIAGNOSIS — R748 Abnormal levels of other serum enzymes: Secondary | ICD-10-CM

## 2015-05-13 DIAGNOSIS — R1013 Epigastric pain: Secondary | ICD-10-CM

## 2015-05-13 NOTE — Telephone Encounter (Signed)
No answer/voicemail.  I will continue to try and reach the patient

## 2015-05-13 NOTE — Telephone Encounter (Signed)
Patient reports that her pain has returned.  She states that her pain is intermittent and now has back pain also.  "middle of my back".  She says you and she discussed possible CT at the last office visit.  Please advise

## 2015-05-13 NOTE — Telephone Encounter (Signed)
CMET, lipase and amylase CT abdomen w/ contrast pancreatic protocol re: abdominal pain (upper) and elevated lipase

## 2015-05-14 NOTE — Telephone Encounter (Signed)
Patient is scheduled for CT scan abdomen with pancreatic protocol on 05/20/15 at 1:30.  She needs to arrive at 1:00 to drink water at CT.  She is advised that she will not drink the normal CT contrast.  She verbalized understanding of instructions and to come for lab work this week.

## 2015-05-15 ENCOUNTER — Other Ambulatory Visit (INDEPENDENT_AMBULATORY_CARE_PROVIDER_SITE_OTHER): Payer: 59

## 2015-05-15 DIAGNOSIS — R748 Abnormal levels of other serum enzymes: Secondary | ICD-10-CM

## 2015-05-15 DIAGNOSIS — R1013 Epigastric pain: Secondary | ICD-10-CM

## 2015-05-15 LAB — COMPREHENSIVE METABOLIC PANEL
ALBUMIN: 4.4 g/dL (ref 3.5–5.2)
ALK PHOS: 60 U/L (ref 39–117)
ALT: 19 U/L (ref 0–35)
AST: 16 U/L (ref 0–37)
BUN: 10 mg/dL (ref 6–23)
CALCIUM: 9.1 mg/dL (ref 8.4–10.5)
CHLORIDE: 104 meq/L (ref 96–112)
CO2: 31 mEq/L (ref 19–32)
Creatinine, Ser: 0.92 mg/dL (ref 0.40–1.20)
GFR: 64.32 mL/min (ref >60.00)
Glucose, Bld: 104 mg/dL — ABNORMAL HIGH (ref 70–99)
POTASSIUM: 4.1 meq/L (ref 3.5–5.1)
SODIUM: 138 meq/L (ref 135–145)
TOTAL PROTEIN: 7.2 g/dL (ref 6.0–8.3)
Total Bilirubin: 0.2 mg/dL (ref 0.2–1.2)

## 2015-05-15 LAB — AMYLASE: Amylase: 34 U/L (ref 27–131)

## 2015-05-15 LAB — LIPASE: Lipase: 115 U/L — ABNORMAL HIGH (ref 11.0–59.0)

## 2015-05-16 NOTE — Progress Notes (Signed)
Quick Note:  Lipase still abnormal Other tests ok ______

## 2015-05-20 ENCOUNTER — Other Ambulatory Visit: Payer: Self-pay | Admitting: Internal Medicine

## 2015-05-20 ENCOUNTER — Encounter: Payer: Self-pay | Admitting: Internal Medicine

## 2015-05-20 ENCOUNTER — Ambulatory Visit (INDEPENDENT_AMBULATORY_CARE_PROVIDER_SITE_OTHER)
Admission: RE | Admit: 2015-05-20 | Discharge: 2015-05-20 | Disposition: A | Discharge status: Home / Self Care | Payer: 59 | Source: Ambulatory Visit | Attending: Internal Medicine | Admitting: Internal Medicine

## 2015-05-20 DIAGNOSIS — R1013 Epigastric pain: Secondary | ICD-10-CM | POA: Diagnosis not present

## 2015-05-20 DIAGNOSIS — R748 Abnormal levels of other serum enzymes: Secondary | ICD-10-CM

## 2015-05-20 DIAGNOSIS — I700 Atherosclerosis of aorta: Secondary | ICD-10-CM | POA: Diagnosis not present

## 2015-05-20 DIAGNOSIS — Q453 Other congenital malformations of pancreas and pancreatic duct: Secondary | ICD-10-CM

## 2015-05-20 HISTORY — DX: Other congenital malformations of pancreas and pancreatic duct: Q45.3

## 2015-05-20 HISTORY — DX: Abnormal levels of other serum enzymes: R74.8

## 2015-05-20 MED ORDER — IOHEXOL 300 MG/ML  SOLN
100.0000 mL | Freq: Once | INTRAMUSCULAR | Status: AC | PRN
  Administered 2015-05-20: 100 mL via INTRAVENOUS

## 2015-05-20 MED ORDER — PANCRELIPASE (LIP-PROT-AMYL) 36000-114000 UNITS PO CPEP: 36000.0000 [IU] | ORAL_CAPSULE | Freq: Three times a day (TID) | ORAL | Status: DC

## 2015-05-20 NOTE — Progress Notes (Signed)
Quick Note:  I called results and explained pancres divisum and how it might be related to sxs. She does not want to pursue tertiary evaluation of that. Pain not severe she says.  Will try pancreatic enzymes and she will schedule f/u me ______

## 2015-06-09 ENCOUNTER — Telehealth: Payer: Self-pay | Admitting: Internal Medicine

## 2015-06-09 MED ORDER — PANCRELIPASE (LIP-PROT-AMYL) 36000-114000 UNITS PO CPEP: 36000.0000 [IU] | ORAL_CAPSULE | Freq: Three times a day (TID) | ORAL | Status: DC

## 2015-06-09 NOTE — Telephone Encounter (Signed)
Spoke with patient and she reports the creon co-pay is $200.00 which is more than she can afford.   I found a coupon and called the [place] [facility] and the patient can get 100 pills for free.  I will mail out a co-pay card also.  I made her a follow up appointment for late August.

## 2015-06-16 ENCOUNTER — Other Ambulatory Visit: Payer: Self-pay

## 2015-06-30 ENCOUNTER — Encounter: Payer: Self-pay | Admitting: *Deleted

## 2015-08-08 DIAGNOSIS — R7301 Impaired fasting glucose: Secondary | ICD-10-CM | POA: Diagnosis not present

## 2015-08-08 DIAGNOSIS — E039 Hypothyroidism, unspecified: Secondary | ICD-10-CM | POA: Diagnosis not present

## 2015-08-08 DIAGNOSIS — E782 Mixed hyperlipidemia: Secondary | ICD-10-CM | POA: Diagnosis not present

## 2015-08-18 ENCOUNTER — Ambulatory Visit: Payer: 59 | Admitting: Internal Medicine

## 2015-08-26 DIAGNOSIS — M542 Cervicalgia: Secondary | ICD-10-CM | POA: Diagnosis not present

## 2015-08-26 DIAGNOSIS — M25512 Pain in left shoulder: Secondary | ICD-10-CM | POA: Diagnosis not present

## 2015-12-22 DIAGNOSIS — M25552 Pain in left hip: Secondary | ICD-10-CM | POA: Diagnosis not present

## 2015-12-22 DIAGNOSIS — M545 Low back pain: Secondary | ICD-10-CM | POA: Diagnosis not present

## 2016-01-06 DIAGNOSIS — M25552 Pain in left hip: Secondary | ICD-10-CM | POA: Diagnosis not present

## 2016-01-06 DIAGNOSIS — M545 Low back pain: Secondary | ICD-10-CM | POA: Diagnosis not present

## 2016-02-11 DIAGNOSIS — E063 Autoimmune thyroiditis: Secondary | ICD-10-CM | POA: Diagnosis not present

## 2016-02-11 DIAGNOSIS — H8111 Benign paroxysmal vertigo, right ear: Secondary | ICD-10-CM | POA: Diagnosis not present

## 2016-02-11 DIAGNOSIS — K219 Gastro-esophageal reflux disease without esophagitis: Secondary | ICD-10-CM | POA: Diagnosis not present

## 2016-02-11 DIAGNOSIS — Z1389 Encounter for screening for other disorder: Secondary | ICD-10-CM | POA: Diagnosis not present

## 2016-02-11 DIAGNOSIS — H6591 Unspecified nonsuppurative otitis media, right ear: Secondary | ICD-10-CM | POA: Diagnosis not present

## 2016-04-15 DIAGNOSIS — L820 Inflamed seborrheic keratosis: Secondary | ICD-10-CM | POA: Diagnosis not present

## 2016-04-15 DIAGNOSIS — I781 Nevus, non-neoplastic: Secondary | ICD-10-CM | POA: Diagnosis not present

## 2016-04-15 DIAGNOSIS — D225 Melanocytic nevi of trunk: Secondary | ICD-10-CM | POA: Diagnosis not present

## 2016-04-15 DIAGNOSIS — L821 Other seborrheic keratosis: Secondary | ICD-10-CM | POA: Diagnosis not present

## 2016-08-17 DIAGNOSIS — J0100 Acute maxillary sinusitis, unspecified: Secondary | ICD-10-CM | POA: Diagnosis not present

## 2016-08-17 DIAGNOSIS — Z1389 Encounter for screening for other disorder: Secondary | ICD-10-CM | POA: Diagnosis not present

## 2016-08-17 DIAGNOSIS — E6609 Other obesity due to excess calories: Secondary | ICD-10-CM | POA: Diagnosis not present

## 2016-08-17 DIAGNOSIS — E063 Autoimmune thyroiditis: Secondary | ICD-10-CM | POA: Diagnosis not present

## 2016-10-01 DIAGNOSIS — Z23 Encounter for immunization: Secondary | ICD-10-CM | POA: Diagnosis not present

## 2016-12-16 DIAGNOSIS — C44319 Basal cell carcinoma of skin of other parts of face: Secondary | ICD-10-CM | POA: Diagnosis not present

## 2016-12-27 DIAGNOSIS — E748 Other specified disorders of carbohydrate metabolism: Secondary | ICD-10-CM | POA: Diagnosis not present

## 2016-12-27 DIAGNOSIS — R1011 Right upper quadrant pain: Secondary | ICD-10-CM | POA: Diagnosis not present

## 2016-12-27 DIAGNOSIS — E278 Other specified disorders of adrenal gland: Secondary | ICD-10-CM | POA: Diagnosis not present

## 2016-12-27 DIAGNOSIS — E039 Hypothyroidism, unspecified: Secondary | ICD-10-CM | POA: Diagnosis not present

## 2016-12-27 DIAGNOSIS — Z6831 Body mass index (BMI) 31.0-31.9, adult: Secondary | ICD-10-CM | POA: Diagnosis not present

## 2016-12-27 DIAGNOSIS — E669 Obesity, unspecified: Secondary | ICD-10-CM | POA: Diagnosis not present

## 2016-12-27 DIAGNOSIS — N3000 Acute cystitis without hematuria: Secondary | ICD-10-CM | POA: Diagnosis not present

## 2016-12-27 DIAGNOSIS — Z1389 Encounter for screening for other disorder: Secondary | ICD-10-CM | POA: Diagnosis not present

## 2016-12-27 DIAGNOSIS — N393 Stress incontinence (female) (male): Secondary | ICD-10-CM | POA: Diagnosis not present

## 2016-12-27 DIAGNOSIS — E6609 Other obesity due to excess calories: Secondary | ICD-10-CM | POA: Diagnosis not present

## 2016-12-31 ENCOUNTER — Other Ambulatory Visit (HOSPITAL_COMMUNITY): Payer: Self-pay | Admitting: Internal Medicine

## 2016-12-31 DIAGNOSIS — R1011 Right upper quadrant pain: Secondary | ICD-10-CM

## 2017-01-02 ENCOUNTER — Ambulatory Visit (HOSPITAL_COMMUNITY)
Admission: EM | Admit: 2017-01-02 | Discharge: 2017-01-02 | Disposition: A | Discharge status: Home / Self Care | Payer: PPO | Attending: Emergency Medicine | Admitting: Emergency Medicine

## 2017-01-02 ENCOUNTER — Encounter (HOSPITAL_COMMUNITY): Payer: Self-pay | Admitting: Emergency Medicine

## 2017-01-02 DIAGNOSIS — H6121 Impacted cerumen, right ear: Secondary | ICD-10-CM

## 2017-01-02 NOTE — ED Triage Notes (Signed)
The patient presented to the [facility] with a complaint of right ear soreness and decreased hearing x 3 days.

## 2017-01-02 NOTE — Discharge Instructions (Signed)
You have a cerumen impaction in your right ear. Your ear has been irrigated and the impaction has been cleared. In the future you may use any over the counter cerumenolytics (ear wax softeners) of your choice to treat ear wax build up at home. Avoid putting any objects such as q tips in your ears. Should your symptoms worsen or fail to improve follow up with your primary care provider or return to clinic.

## 2017-01-02 NOTE — ED Provider Notes (Signed)
CSN: [id number]     Arrival date & time 01/02/17  1201 History   First MD Initiated Contact with Patient 01/02/17 1227     Chief Complaint  Patient presents with  . Otalgia   (Consider location/radiation/quality/duration/timing/severity/associated sxs/prior Treatment) 71 year old female presents with four day history of ringing and "poping" in her right ear, she reports she has tried over the counter drops without relief. She denies congestion, recent history of URI, fever, or other systemic symptoms. She denies pain, but does feel pressure in her ear.   The history is provided by the patient.  Otalgia  Associated symptoms: no congestion, no ear discharge, no rhinorrhea and no sore throat     Past Medical History:  Diagnosis Date  . Anxiety   . Elevated lipase 05/20/2015  . Hyperlipidemia   . Pancreas divisum 05/20/2015  . Thyroid disease    Past Surgical History:  Procedure Laterality Date  . ADRENALECTOMY Right 05/22/14  . BRAVO [place] STUDY  2011   negative  . COLONOSCOPY    . ESOPHAGOGASTRODUODENOSCOPY    . TUBAL LIGATION     Family History  Problem Relation Age of Onset  . Diabetes Mother   . Heart disease Mother   . Diabetes Maternal Aunt   . Colon cancer Maternal Grandfather    Social History  Substance Use Topics  . Smoking status: Former Smoker    Types: Cigarettes    Quit date: 12/21/2007  . Smokeless tobacco: Never Used     Comment: Quit in 2009  . Alcohol use No   OB History    No data available     Review of Systems  HENT: Positive for ear pain (pressure). Negative for congestion, ear discharge, rhinorrhea, sinus pain, sinus pressure and sore throat.   Eyes: Negative.   Respiratory: Negative.   Cardiovascular: Negative.   Gastrointestinal: Negative.   Genitourinary: Negative.   Neurological: Negative.     Allergies  Crestor [rosuvastatin]; Lipitor [atorvastatin]; Metoprolol tartrate; and Penicillins  Home Medications   Prior to Admission  medications   Medication Sig Start Date End Date Taking? Authorizing Provider  b complex vitamins tablet Take 1 tablet by mouth daily.   Yes Historical Provider, MD  Calcium Carbonate-Vitamin D (CALCIUM + D PO) Take 1,000 mg by mouth daily.   Yes Historical Provider, MD  co-enzyme Q-10 30 MG capsule Take 30 mg by mouth 3 (three) times daily.   Yes Historical Provider, MD  fish oil-omega-3 fatty acids 1000 MG capsule Take 1 g by mouth 3 (three) times daily.    Yes Historical Provider, MD  GRAPE SEED CR PO Take 1 tablet by mouth daily.   Yes Historical Provider, MD  Lactobacillus (ACIDOPHILUS PO) Take 2 capsules by mouth daily.   Yes Historical Provider, MD  levothyroxine (SYNTHROID, LEVOTHROID) 112 MCG tablet Take 112 mcg by mouth daily before breakfast.   Yes Historical Provider, MD  lipase/protease/amylase (CREON) 36000 UNITS CPEP capsule Take 1 capsule (36,000 Units total) by mouth 3 (three) times daily with meals. And snacks 06/09/15  Yes Gatha Mayer, MD  MILK THISTLE PO Take 1 tablet by mouth daily.   Yes Historical Provider, MD   Meds Ordered and Administered this Visit  Medications - No data to display  BP 128/55 (BP Location: Left Arm)   Pulse 83   Temp 98.2 F (36.8 C) (Oral)   Resp 18   SpO2 98%  No data found.   Physical Exam  Constitutional: She  is oriented to person, place, and time. She appears well-developed and well-nourished. No distress.  HENT:  Head: Normocephalic.  Right Ear: External ear normal.  Left Ear: Tympanic membrane and external ear normal.  Nose: Nose normal.  Mouth/Throat: Oropharynx is clear and moist. No oropharyngeal exudate.  Right ear cerumen impaction  Neck: Normal range of motion. Neck supple. No JVD present.  Cardiovascular: Normal rate and regular rhythm.   Pulmonary/Chest: Effort normal and breath sounds normal.  Abdominal: Soft. Bowel sounds are normal.  Lymphadenopathy:    She has no cervical adenopathy.  Neurological: She is alert  and oriented to person, place, and time.  Skin: Skin is warm and dry. Capillary refill takes less than 2 seconds. She is not diaphoretic.  Psychiatric: She has a normal mood and affect.  Nursing note and vitals reviewed.   Urgent Care Course   Clinical Course     Procedures (including critical care time)  Labs Review Labs Reviewed - No data to display  Imaging Review No results found.   Visual Acuity Review  Right Eye Distance:   Left Eye Distance:   Bilateral Distance:    Right Eye Near:   Left Eye Near:    Bilateral Near:         MDM   1. Impacted cerumen of right ear   You have a cerumen impaction in your right ear. Your ear has been irrigated and the impaction has been cleared. In the future you may use any over the counter cerumenolytics (ear wax softeners) of your choice to treat ear wax build up at home. Avoid putting any objects such as q tips in your ears. Should your symptoms worsen or fail to improve follow up with your primary care provider or return to clinic.     Barnet Glasgow, NP 01/02/17 1303

## 2017-01-05 ENCOUNTER — Ambulatory Visit (HOSPITAL_COMMUNITY): Payer: PPO

## 2017-01-12 ENCOUNTER — Ambulatory Visit (HOSPITAL_COMMUNITY)
Admission: RE | Admit: 2017-01-12 | Discharge: 2017-01-12 | Disposition: A | Discharge status: Home / Self Care | Payer: PPO | Source: Ambulatory Visit | Attending: Internal Medicine | Admitting: Internal Medicine

## 2017-01-12 DIAGNOSIS — R1011 Right upper quadrant pain: Secondary | ICD-10-CM | POA: Insufficient documentation

## 2017-01-12 DIAGNOSIS — R932 Abnormal findings on diagnostic imaging of liver and biliary tract: Secondary | ICD-10-CM | POA: Diagnosis not present

## 2017-01-14 ENCOUNTER — Other Ambulatory Visit (HOSPITAL_COMMUNITY): Payer: Self-pay | Admitting: Internal Medicine

## 2017-01-14 DIAGNOSIS — R109 Unspecified abdominal pain: Secondary | ICD-10-CM

## 2017-01-20 DIAGNOSIS — Z85828 Personal history of other malignant neoplasm of skin: Secondary | ICD-10-CM | POA: Diagnosis not present

## 2017-01-20 DIAGNOSIS — Z08 Encounter for follow-up examination after completed treatment for malignant neoplasm: Secondary | ICD-10-CM | POA: Diagnosis not present

## 2017-01-21 ENCOUNTER — Encounter (HOSPITAL_COMMUNITY): Payer: PPO

## 2017-01-27 ENCOUNTER — Encounter (HOSPITAL_COMMUNITY): Payer: PPO

## 2017-02-03 ENCOUNTER — Other Ambulatory Visit (HOSPITAL_COMMUNITY): Payer: PPO

## 2017-02-10 ENCOUNTER — Encounter (HOSPITAL_COMMUNITY)
Admission: RE | Admit: 2017-02-10 | Discharge: 2017-02-10 | Disposition: A | Discharge status: Home / Self Care | Payer: PPO | Source: Ambulatory Visit | Attending: Internal Medicine | Admitting: Internal Medicine

## 2017-02-10 ENCOUNTER — Encounter (HOSPITAL_COMMUNITY): Payer: Self-pay

## 2017-02-10 DIAGNOSIS — R109 Unspecified abdominal pain: Secondary | ICD-10-CM | POA: Diagnosis not present

## 2017-02-10 DIAGNOSIS — R1011 Right upper quadrant pain: Secondary | ICD-10-CM | POA: Diagnosis not present

## 2017-02-10 MED ORDER — TECHNETIUM TC 99M MEBROFENIN IV KIT
5.0000 | PACK | Freq: Once | INTRAVENOUS | Status: AC | PRN
  Administered 2017-02-10: 5.1 via INTRAVENOUS

## 2017-02-22 ENCOUNTER — Ambulatory Visit (INDEPENDENT_AMBULATORY_CARE_PROVIDER_SITE_OTHER): Payer: PPO | Admitting: Physician Assistant

## 2017-02-22 ENCOUNTER — Encounter: Payer: Self-pay | Admitting: Physician Assistant

## 2017-02-22 VITALS — BP 118/82 | HR 84 | Ht 65.0 in | Wt 166.5 lb

## 2017-02-22 DIAGNOSIS — Q453 Other congenital malformations of pancreas and pancreatic duct: Secondary | ICD-10-CM | POA: Diagnosis not present

## 2017-02-22 DIAGNOSIS — M940 Chondrocostal junction syndrome [Tietze]: Secondary | ICD-10-CM

## 2017-02-22 DIAGNOSIS — R1013 Epigastric pain: Secondary | ICD-10-CM

## 2017-02-22 NOTE — Patient Instructions (Addendum)
If you are age 71 or older, your body mass index should be between 23-30. Your Body mass index is 27.71 kg/m. If this is out of the aforementioned range listed, please consider follow up with your Primary Care Provider.  If you are age 48 or younger, your body mass index should be between 19-25. Your Body mass index is 27.71 kg/m. If this is out of the aformentioned range listed, please consider follow up with your Primary Care Provider.   Please follow up with Dr. Carlean Purl on 03/31/17 at 10:45am  You have been given a handout on anti-reflux.  Please continue your Dexilant  You may use a heating pad or ice pack over your ribs as instructed by Anderson Malta.  Thank you.

## 2017-02-22 NOTE — Progress Notes (Addendum)
Chief Complaint: Epigastric and Right sided abdominal pain  HPI:    Ana Gutierrez is a 71 year old Caucasian female with a past medical history of anxiety, elevated lipase, pancreas divisum and thyroid disease who was referred to me by Redmond School, MD for a complaint of epigastric and right sided abdominal pain.     Per chart reviewed it appears patient follows with Dr. Carlean Purl. The last time she did so was 04/07/15 for an elevated lipase. At that time it was noted she had a persistently elevated lipase which had been followed some over time. A CT of the abdomen and pelvis and MRI of the abdomen and pelvis had revealed an adrenal adenoma which was resected. Ultrasound in October had been unrevealing other than fatty liver. She did complain of some right upper quadrant tenderness and pain at that time with tenderness with palpation. It was noted she had some GERD symptomatology but was able to wean herself off of PPI years before then. It was thought possibly Zetia was causing her elevated lipase. Repeat CT was recommended in a couple of weeks if she continued with symptoms. If that was unrevealing it was recommended she have an EGD.   Patient had a repeat CT abdomen pelvis 05/20/15 which revealed no acute abdominal findings, mass lesions or adenopathy. There was pancreatic divisum noted. No pancreatic mass, inflammatory changes or ductal dilatation. Status post right adrenalectomy. Stable advanced atherosclerotic calcifications involving the aorta. Patient was called and pancreatic divisum were explained and how it could be related to her symptoms. Supplemental pancreatic enzymes were tried.   The patient recently had a ultrasound of her abdomen 01/12/17 which showed mildly increased hepatic echotexture compatible with fatty infiltration change. Normal appearance of the gallbladder and common bile duct. Limited visualization of the pancreas. No acute intra-abdominal abnormality was observed. Patient then  had Hida scan with [facility] on 02/10/17 which showed a patent biliary tree, normal gallbladder response to fatty meal stimulation with a normal gallbladder ejection fraction of 64%. Patient did report some abdominal pain following Ensure.   Today, the patient tells me that since January she has continued to notice what sounds like 2 separate pains. She describes a right sided abdominal pain which is over her ribs and under her breast on that side, this is somewhat relieved when she places an ice pack on this side. She is not sure when it comes and goes it just "is off and on". This is just "aggravating". This is no better or worse when she moves around or takes a deep breath. Patient does not she did a lot of "pulling" around Christmas time.   Patient also describes an epigastric discomfort which is "off and on" since January. Patient notes that this is not all the time and she is unsure if it is specifically related to food but can recall certain instances where it occurred right after swallowing her food and it "hit her stomach". She also describes associated nausea and vomiting off and on. She tells me she did try Creon but this gave her terrible abdominal pain, so she stopped this. She was started on Dexilant 60 mg daily "a while ago", but has only been taking this consistently for the past 5 days because she was trying not "to start another chronic medicine". Patient tells me that over the past 5 days she seems to have had less symptoms. She does describe an  "acidic taste" in the back of her mouth at times.   Patient does  tell me that the Ensure she was given at time of HIDA scan tasted absolutely "horrible" to her.   Patient denies fever, chills, blood in her stool, melena, change in bowel habits, weight loss, fatigue, anorexia, dysphagia or symptoms that awaken her at night.  Past Medical History:  Diagnosis Date  . Anxiety   . Elevated lipase 05/20/2015  . Hyperlipidemia   . Pancreas divisum 05/20/2015    . Thyroid disease     Past Surgical History:  Procedure Laterality Date  . ADRENALECTOMY Right 05/22/14  . BRAVO [place] STUDY  2011   negative  . COLONOSCOPY    . ESOPHAGOGASTRODUODENOSCOPY    . TUBAL LIGATION      Current Outpatient Prescriptions  Medication Sig Dispense Refill  . b complex vitamins tablet Take 1 tablet by mouth daily.    . cholecalciferol (VITAMIN D) 400 units TABS tablet Take 400 Units by mouth daily.    Marland Kitchen co-enzyme Q-10 30 MG capsule Take 30 mg by mouth 3 (three) times daily.    . Coenzyme Q10 (CO Q 10) 100 MG CAPS Take by mouth daily.    Marland Kitchen dexlansoprazole (DEXILANT) 60 MG capsule Take 60 mg by mouth daily.    . Digestive Enzymes (MULTI-ENZYME PO) Take by mouth 2 (two) times daily.    . fish oil-omega-3 fatty acids 1000 MG capsule Take 1 g by mouth 3 (three) times daily.     . Lactobacillus (ACIDOPHILUS PO) Take 2 capsules by mouth daily.    Marland Kitchen levothyroxine (SYNTHROID, LEVOTHROID) 112 MCG tablet Take 112 mcg by mouth daily before breakfast.    . MILK THISTLE PO Take 1 tablet by mouth daily.    . Multiple Vitamin (MULTIVITAMIN) tablet Take 1 tablet by mouth daily.     No current facility-administered medications for this visit.     Allergies as of 02/22/2017 - Review Complete 02/22/2017  Allergen Reaction Noted  . Crestor [rosuvastatin] Other (See Comments) 01/28/2015  . Lipitor [atorvastatin]  06/30/2015  . Metoprolol tartrate Other (See Comments) 01/28/2015  . Penicillins Other (See Comments) 02/14/2012    Family History  Problem Relation Age of Onset  . Diabetes Mother   . Heart disease Mother   . Diabetes Maternal Aunt   . Colon cancer Maternal Grandfather     Social History   Social History  . Marital status: Married    Spouse name: N/A  . Number of children: 2  . Years of education: N/A   Occupational History  . work force    Social History Main Topics  . Smoking status: Former Smoker    Types: Cigarettes    Quit date: 12/21/2007  .  Smokeless tobacco: Never Used     Comment: Quit in 2009  . Alcohol use No  . Drug use: No  . Sexual activity: Not on file   Other Topics Concern  . Not on file   Social History Narrative  . No narrative on file    Review of Systems:    Constitutional: No weight loss, fever or chills Skin: No rash  Cardiovascular: No chest pain Respiratory: No SOB Gastrointestinal: See HPI and otherwise negative Genitourinary: No dysuria  Neurological: No headache Musculoskeletal: No new muscle or joint pain Hematologic: No bleeding or bruising Psychiatric: No history of depression or anxiety   Physical Exam:  Vital signs: BP 118/82   Pulse 84   Ht 5\' 5"  (1.651 m)   Wt 166 lb 8 oz (75.5 kg)   BMI  27.71 kg/m   Constitutional:   Very Pleasant elderly Caucasian female appears to be in NAD, Well developed, Well nourished, alert and cooperative Head:  Normocephalic and atraumatic. Eyes:   PEERL, EOMI. No icterus. Conjunctiva pink. Ears:  Normal auditory acuity. Neck:  Supple Throat: Oral cavity and pharynx without inflammation, swelling or lesion.  Respiratory: Respirations even and unlabored. Lungs clear to auscultation bilaterally.   No wheezes, crackles, or rhonchi.  Cardiovascular: Normal S1, S2. No MRG. Regular rate and rhythm. No peripheral edema, cyanosis or pallor.  Gastrointestinal:  Soft, nondistended, mild epigastric ttp No rebound or guarding. Normal bowel sounds. No appreciable masses or hepatomegaly. Rectal:  Not performed.  Msk: moderate right sided rib ttp Symmetrical without gross deformities. Without edema, no deformity or joint abnormality.  Neurologic:  Alert and  oriented x4;  grossly normal neurologically.  Skin:   Dry and intact without significant lesions or rashes. Psychiatric:  Demonstrates good judgement and reason without abnormal affect or behaviors.  Recent labs: Requested from PCP  ABDOMEN ULTRASOUND COMPLETE 01/12/17  COMPARISON:  CT scan of the abdomen  of May 20, 2015.  FINDINGS: Gallbladder: No gallstones or wall thickening visualized. No sonographic Murphy sign noted by sonographer.  Common bile duct: Diameter: 3.4 mm  Liver: The hepatic echotexture is mildly increased diffusely. There is no focal mass nor ductal dilation. The surface contour of the liver is smooth.  IVC: No abnormality visualized.  Pancreas: Visualization of the pancreatic head and tail is limited by bowel gas. The pancreatic body appears normal.  Spleen: Size and appearance within normal limits.  Right Kidney: Length: 9.2 cm. The renal cortical echotexture is normal. There is no parenchymal mass or hydronephrosis.  Left Kidney: Length: 10.3 cm. The renal cortical echotexture is similar to that of the right kidney. There is no focal mass or hydronephrosis.  Abdominal aorta: No aneurysm visualized.  Other findings: There is no ascites.  IMPRESSION: Mildly increased hepatic echotexture compatible with fatty infiltrative change. Normal appearance of the gallbladder and common bile duct. Limited visualization of the pancreas.  No acute intra-abdominal abnormality is observed.   Electronically Signed   By: David  Martinique M.D.   On: 01/12/2017 11:02  EXAM: NUCLEAR MEDICINE HEPATOBILIARY IMAGING WITH GALLBLADDER EF 02/10/17  TECHNIQUE: Sequential images of the abdomen were obtained out to 60 minutes following intravenous administration of radiopharmaceutical. After oral ingestion of Ensure, gallbladder ejection fraction was determined. At 60 min, normal ejection fraction is greater than 33%.  RADIOPHARMACEUTICALS:  5.1 mCi Tc-44m  Choletec IV  COMPARISON:  02/12/2010  FINDINGS: Normal tracer extraction from bloodstream indicating normal hepatocellular function.  Normal excretion of tracer into biliary tree.  Gallbladder visualized at 7 min.  Small bowel visualized at 14 min.  No hepatic retention of  tracer.  Subjectively normal emptying of tracer from gallbladder following fatty meal stimulation.  Calculated gallbladder ejection fraction is 64%, normal.  Patient reported mild abdominal pain following Ensure ingestion.  Normal gallbladder ejection fraction following Ensure ingestion is greater than 33% at 1 hour.  IMPRESSION: Patent biliary tree.  Normal gallbladder response to fatty meal stimulation with a normal gallbladder ejection fraction of 64%.  Patient reported abdominal pain following Ensure.   Electronically Signed   By: Lavonia Dana M.D.   On: 02/10/2017 10:40  Assessment: 1. Right sided rib pain: Patient noted this start in January after "pulling a lot of things" at Christmas time, ice seems to help this pain; likely costochondritis/ musculoskeletal etiology 2. Epigastric  pain: Seems related to eating at most times, some bettwr over past 5 days on Dexilant 60mg  qd, consider relation to gastritis versus GERD versus other 3. Pancreatic divisum: Noted during previous workup with elevated lipase, consider relation to recent pain? Pt has declined referral to tertiary center for this in the past  Plan: 1. Requested most recent labs from PCP, pt tells me normal CMP and CBC as well as Amylase and Lipase 2. Patient has been doing well over the past 5 days since starting Dexilant 60 mg daily. We did discuss that this is beneficial for her to remain on this medication. She tells me this has been approved by her insurance over the next year. 3. Patient advised to place heating/cooling pads over the right side of her ribs as this seems to help her pain which I discussed is likely costochondritis. Evaluation of her gallbladder recently was negative. 4. Patient will call our office if she continues with pain or has an increase in symptoms, otherwise she will follow with Dr. Carlean Purl at his next available appointment time at the end of April. If no change in symptoms could  consider EGD  Ellouise Newer, PA-C [facility] 02/22/2017, 12:06 PM  Cc: Redmond School, MD   Agree with Ms. Hilman Kissling's evaluation and management. Gatha Mayer, MD, [facility]

## 2017-03-29 ENCOUNTER — Ambulatory Visit: Payer: PPO | Admitting: Internal Medicine

## 2017-03-31 ENCOUNTER — Ambulatory Visit: Payer: PPO | Admitting: Internal Medicine

## 2017-05-09 DIAGNOSIS — B078 Other viral warts: Secondary | ICD-10-CM | POA: Diagnosis not present

## 2017-05-09 DIAGNOSIS — X32XXXD Exposure to sunlight, subsequent encounter: Secondary | ICD-10-CM | POA: Diagnosis not present

## 2017-05-09 DIAGNOSIS — L570 Actinic keratosis: Secondary | ICD-10-CM | POA: Diagnosis not present

## 2017-05-09 DIAGNOSIS — D225 Melanocytic nevi of trunk: Secondary | ICD-10-CM | POA: Diagnosis not present

## 2017-06-03 DIAGNOSIS — J029 Acute pharyngitis, unspecified: Secondary | ICD-10-CM | POA: Diagnosis not present

## 2017-06-03 DIAGNOSIS — J0100 Acute maxillary sinusitis, unspecified: Secondary | ICD-10-CM | POA: Diagnosis not present

## 2017-06-03 DIAGNOSIS — E6609 Other obesity due to excess calories: Secondary | ICD-10-CM | POA: Diagnosis not present

## 2017-06-03 DIAGNOSIS — Z6830 Body mass index (BMI) 30.0-30.9, adult: Secondary | ICD-10-CM | POA: Diagnosis not present

## 2017-06-06 DIAGNOSIS — Z1211 Encounter for screening for malignant neoplasm of colon: Secondary | ICD-10-CM | POA: Diagnosis not present

## 2017-06-08 DIAGNOSIS — Z6830 Body mass index (BMI) 30.0-30.9, adult: Secondary | ICD-10-CM | POA: Diagnosis not present

## 2017-06-08 DIAGNOSIS — K219 Gastro-esophageal reflux disease without esophagitis: Secondary | ICD-10-CM | POA: Diagnosis not present

## 2017-06-08 DIAGNOSIS — B355 Tinea imbricata: Secondary | ICD-10-CM | POA: Diagnosis not present

## 2017-06-08 DIAGNOSIS — J329 Chronic sinusitis, unspecified: Secondary | ICD-10-CM | POA: Diagnosis not present

## 2017-06-08 DIAGNOSIS — B354 Tinea corporis: Secondary | ICD-10-CM | POA: Diagnosis not present

## 2017-06-09 DIAGNOSIS — E063 Autoimmune thyroiditis: Secondary | ICD-10-CM | POA: Diagnosis not present

## 2017-06-09 DIAGNOSIS — E785 Hyperlipidemia, unspecified: Secondary | ICD-10-CM | POA: Diagnosis not present

## 2017-06-09 DIAGNOSIS — E782 Mixed hyperlipidemia: Secondary | ICD-10-CM | POA: Diagnosis not present

## 2017-06-15 DIAGNOSIS — K219 Gastro-esophageal reflux disease without esophagitis: Secondary | ICD-10-CM | POA: Diagnosis not present

## 2017-06-15 DIAGNOSIS — R51 Headache: Secondary | ICD-10-CM | POA: Diagnosis not present

## 2017-06-15 DIAGNOSIS — J324 Chronic pansinusitis: Secondary | ICD-10-CM | POA: Diagnosis not present

## 2017-06-19 ENCOUNTER — Encounter (HOSPITAL_COMMUNITY): Payer: Self-pay

## 2017-06-19 ENCOUNTER — Emergency Department (HOSPITAL_COMMUNITY)
Admission: EM | Admit: 2017-06-19 | Discharge: 2017-06-20 | Disposition: A | Discharge status: Home / Self Care | Payer: PPO | Attending: Emergency Medicine | Admitting: Emergency Medicine

## 2017-06-19 ENCOUNTER — Emergency Department (HOSPITAL_COMMUNITY): Payer: PPO

## 2017-06-19 DIAGNOSIS — K219 Gastro-esophageal reflux disease without esophagitis: Secondary | ICD-10-CM | POA: Diagnosis not present

## 2017-06-19 DIAGNOSIS — Z87891 Personal history of nicotine dependence: Secondary | ICD-10-CM | POA: Diagnosis not present

## 2017-06-19 DIAGNOSIS — Z79899 Other long term (current) drug therapy: Secondary | ICD-10-CM | POA: Diagnosis not present

## 2017-06-19 DIAGNOSIS — E785 Hyperlipidemia, unspecified: Secondary | ICD-10-CM | POA: Insufficient documentation

## 2017-06-19 DIAGNOSIS — R079 Chest pain, unspecified: Secondary | ICD-10-CM | POA: Diagnosis not present

## 2017-06-19 DIAGNOSIS — F419 Anxiety disorder, unspecified: Secondary | ICD-10-CM | POA: Insufficient documentation

## 2017-06-19 LAB — CBC
HCT: 41.9 % (ref 36.0–46.0)
Hemoglobin: 13.6 g/dL (ref 12.0–15.0)
MCH: 31.3 pg (ref 26.0–34.0)
MCHC: 32.5 g/dL (ref 30.0–36.0)
MCV: 96.3 fL (ref 78.0–100.0)
PLATELETS: 259 10*3/uL (ref 150–400)
RBC: 4.35 MIL/uL (ref 3.87–5.11)
RDW: 14.2 % (ref 11.5–15.5)
WBC: 11.5 10*3/uL — AB (ref 4.0–10.5)

## 2017-06-19 LAB — I-STAT TROPONIN, ED: TROPONIN I, POC: 0 ng/mL (ref 0.00–0.08)

## 2017-06-19 LAB — BASIC METABOLIC PANEL
Anion gap: 11 (ref 5–15)
BUN: 13 mg/dL (ref 6–20)
CALCIUM: 9 mg/dL (ref 8.9–10.3)
CO2: 22 mmol/L (ref 22–32)
CREATININE: 0.91 mg/dL (ref 0.44–1.00)
Chloride: 106 mmol/L (ref 101–111)
GFR calc non Af Amer: >60 mL/min (ref >60)
Glucose, Bld: 91 mg/dL (ref 65–99)
Potassium: 4 mmol/L (ref 3.5–5.1)
SODIUM: 139 mmol/L (ref 135–145)

## 2017-06-19 MED ORDER — ASPIRIN 81 MG PO CHEW
324.0000 mg | CHEWABLE_TABLET | Freq: Once | ORAL | Status: AC
Start: 2017-06-19 — End: 2017-06-19
  Administered 2017-06-19: 324 mg via ORAL
  Filled 2017-06-19: qty 4

## 2017-06-19 MED ORDER — GI COCKTAIL ~~LOC~~
30.0000 mL | Freq: Once | ORAL | Status: AC
  Administered 2017-06-19: 30 mL via ORAL
  Filled 2017-06-19: qty 30

## 2017-06-19 MED ORDER — SUCRALFATE 1 GM/10ML PO SUSP: 1.0000 g | Freq: Three times a day (TID) | ORAL | 0 refills | Status: DC

## 2017-06-19 NOTE — ED Provider Notes (Signed)
[facility] DEPT Provider Note   CSN: [id number] Arrival date & time: 06/19/17  2104     History   Chief Complaint Chief Complaint  Patient presents with  . Chest Pain    HPI   Blood pressure (!) 204/89, pulse 84, temperature 98 F (36.7 C), temperature source Oral, resp. rate 17, SpO2 97 %.  Ana Gutierrez is a 71 y.o. female with past medical history significant for tobacco use, hyperlipidemia complaining of what she initially thought was acid reflux, she took Gaviscon. This started yesterday while she was gardening. She did not get any relief from the Gaviscon. It's been constant with her over the last several days. Pain is on the left side and radiates around to the left back. She states that she doesn't have any nausea, diaphoresis, shortness of breath. She describes it as burning and she also states that she feels like she is choking sometimes is similar to prior reflux exacerbations. Denies peripheral edema, orthopnea, PND, history of cardiac issues. She states she had a stress test sometime in the remote past.   Past Medical History:  Diagnosis Date  . Anxiety   . Elevated lipase 05/20/2015  . Hyperlipidemia   . Pancreas divisum 05/20/2015  . Thyroid disease     Patient Active Problem List   Diagnosis Date Noted  . Pancreas divisum 05/20/2015  . Elevated lipase 05/20/2015  . Unspecified hypothyroidism 03/26/2013  . SORE THROAT 02/25/2010  . GERD 02/25/2010    Past Surgical History:  Procedure Laterality Date  . ADRENALECTOMY Right 05/22/14  . BRAVO [place] STUDY  2011   negative  . COLONOSCOPY    . ESOPHAGOGASTRODUODENOSCOPY    . TUBAL LIGATION      OB History    No data available       Home Medications    Prior to Admission medications   Medication Sig Start Date End Date Taking? Authorizing Provider  b complex vitamins tablet Take 1 tablet by mouth daily.   Yes [provider]  clindamycin (CLEOCIN) 300 MG capsule Take 300 mg by mouth 3  (three) times daily. For 14 days (started 06/15/17) 06/15/17 06/29/17 Yes [provider]  Coenzyme Q10 (CO Q 10) 100 MG CAPS Take 100 mg by mouth daily.    Yes [provider]  dexlansoprazole (DEXILANT) 60 MG capsule Take 60 mg by mouth daily.   Yes [provider]  fish oil-omega-3 fatty acids 1000 MG capsule Take 2 g by mouth daily.    Yes [provider]  levothyroxine (SYNTHROID, LEVOTHROID) 112 MCG tablet Take 112 mcg by mouth daily before breakfast.   Yes [provider]  Multiple Vitamin (MULTIVITAMIN) tablet Take 1 tablet by mouth daily.   Yes [provider]  sucralfate (CARAFATE) 1 GM/10ML suspension Take 10 mLs (1 g total) by mouth 4 (four) times daily -  with meals and at bedtime. 06/19/17   Birda Didonato, Elmyra Ricks, PA-C    Family History Family History  Problem Relation Age of Onset  . Diabetes Mother   . Heart disease Mother   . Diabetes Maternal Aunt   . Colon cancer Maternal Grandfather     Social History Social History  Substance Use Topics  . Smoking status: Former Smoker    Types: Cigarettes    Quit date: 12/21/2007  . Smokeless tobacco: Never Used     Comment: Quit in 2009  . Alcohol use No     Allergies   Crestor [rosuvastatin]; Lipitor [atorvastatin]; Metoprolol  tartrate; and Penicillins   Review of Systems Review of Systems  A complete review of systems was obtained and all systems are negative except as noted in the HPI and PMH.   Physical Exam Updated Vital Signs BP 134/75   Pulse 67   Temp 98 F (36.7 C) (Oral)   Resp 18   SpO2 95%   Physical Exam  Constitutional: She is oriented to person, place, and time. She appears well-developed and well-nourished. No distress.  HENT:  Head: Normocephalic and atraumatic.  Mouth/Throat: Oropharynx is clear and moist.  Eyes: Conjunctivae and EOM are normal. Pupils are equal, round, and reactive to light.  Neck: Normal range of motion. No JVD present. No  tracheal deviation present.  Cardiovascular: Normal rate, regular rhythm and intact distal pulses.   Radial pulse equal bilaterally  Pulmonary/Chest: Effort normal and breath sounds normal. No stridor. No respiratory distress. She has no wheezes. She has no rales. She exhibits tenderness.  Mild tenderness palpation along the left chest with no underlying crepitance  Abdominal: Soft. She exhibits no distension and no mass. There is no tenderness. There is no rebound and no guarding.  Musculoskeletal: Normal range of motion. She exhibits no edema or tenderness.  No calf asymmetry, superficial collaterals, palpable cords, edema, Homans sign negative bilaterally.    Neurological: She is alert and oriented to person, place, and time.  Skin: Skin is warm. She is not diaphoretic.  Psychiatric: She has a normal mood and affect.  Nursing note and vitals reviewed.    ED Treatments / Results  Labs (all labs ordered are listed, but only abnormal results are displayed) Labs Reviewed  CBC - Abnormal; Notable for the following:       Result Value   WBC 11.5 (*)    All other components within normal limits  BASIC METABOLIC PANEL  I-STAT TROPOININ, ED    EKG  EKG Interpretation  Date/Time:  Sunday June 19 2017 21:08:36 EDT Ventricular Rate:  83 PR Interval:  146 QRS Duration: 78 QT Interval:  372 QTC Calculation: 437 R Axis:   6 Text Interpretation:  Normal sinus rhythm Nonspecific ST abnormality Abnormal ECG No significant change since last tracing Confirmed by Lacretia Leigh ([id number]) on 06/19/2017 10:37:14 PM       Radiology Dg Chest 2 View  Result Date: 06/19/2017 CLINICAL DATA:  Left-sided chest pain beginning yesterday. EXAM: CHEST  2 VIEW COMPARISON:  Apr 19, 2013 FINDINGS: Scarring is seen in the left lung base. The heart, hila, mediastinum, lungs, and pleura are otherwise normal. IMPRESSION: No active cardiopulmonary disease. Electronically Signed   By: Dorise Bullion III M.D   On:  06/19/2017 22:04    Procedures Procedures (including critical care time)  Medications Ordered in ED Medications  aspirin chewable tablet 324 mg (324 mg Oral Given 06/19/17 2252)  gi cocktail (Maalox,Lidocaine,Donnatal) (30 mLs Oral Given 06/19/17 2252)     Initial Impression / Assessment and Plan / ED Course  I have reviewed the triage vital signs and the nursing notes.  Pertinent labs & imaging results that were available during my care of the patient were reviewed by me and considered in my medical decision making (see chart for details).    Vitals:   06/19/17 2200 06/19/17 2215 06/19/17 2245 06/19/17 2345  BP: (!) 142/82 138/83 (!) 154/90 134/75  Pulse: 73 70 70 67  Resp: 11 (!) 22 13 18   Temp:      TempSrc:      SpO2:  96% 97% 98% 95%    Medications  aspirin chewable tablet 324 mg (324 mg Oral Given 06/19/17 2252)  gi cocktail (Maalox,Lidocaine,Donnatal) (30 mLs Oral Given 06/19/17 2252)    Phoua H Toro is 71 y.o. female presenting with choking and burning sensation in the chest starting yesterday. Pain is typical for her prior reflux episodes. EKG unchanged, troponin negative, chest x-ray negative. I think this is very unlikely ACS.  This is a shared visit with the attending physician who personally evaluated the patient and agrees with the care plan.   Evaluation does not show pathology that would require ongoing emergent intervention or inpatient treatment. Pt is hemodynamically stable and mentating appropriately. Discussed findings and plan with patient/guardian, who agrees with care plan. All questions answered. Return precautions discussed and outpatient follow up given.    Final Clinical Impressions(s) / ED Diagnoses   Final diagnoses:  Gastroesophageal reflux disease, esophagitis presence not specified    New Prescriptions New Prescriptions   SUCRALFATE (CARAFATE) 1 GM/10ML SUSPENSION    Take 10 mLs (1 g total) by mouth 4 (four) times daily -  with meals and  at bedtime.     Monico Blitz, Hershal Coria 06/20/17 0005

## 2017-06-19 NOTE — Discharge Instructions (Signed)
Please follow with your primary care doctor in the next 2 days for a check-up. They must obtain records for further management.  ° °Do not hesitate to return to the Emergency Department for any new, worsening or concerning symptoms.  ° °

## 2017-06-19 NOTE — ED Provider Notes (Signed)
Medical screening examination/treatment/procedure(s) were conducted as a shared visit with non-physician practitioner(s) and myself.  I personally evaluated the patient during the encounter.   EKG Interpretation  Date/Time:  Sunday June 19 2017 21:08:36 EDT Ventricular Rate:  83 PR Interval:  146 QRS Duration: 78 QT Interval:  372 QTC Calculation: 437 R Axis:   6 Text Interpretation:  Normal sinus rhythm Nonspecific ST abnormality Abnormal ECG No significant change since last tracing Confirmed by Hiroshi Krummel ([id number]) on 06/19/2017 10:37:14 PM     71  year old female here complaining of epigastric dull ache which began last night which selective burning. Discomfort was relieved with antacids. Denies any anginal CHF qualities to her symptoms. EKG here is unchanged from prior. Given GI cocktail and feels better. Troponin negative. Suspect GERD and patient stable for discharge   Lacretia Leigh, MD 06/19/17 2323

## 2017-06-19 NOTE — ED Triage Notes (Signed)
Pt reports left sided chest pain that started last night that she described as "it felt like really bad heart burn" onset when she was watering the garden. She states the pain feels like a sharp pain now and she also reports pain in her neck. She denies SOB/n/v/d/diaphoresis.

## 2017-06-20 ENCOUNTER — Telehealth: Payer: Self-pay | Admitting: Internal Medicine

## 2017-06-20 NOTE — Telephone Encounter (Addendum)
Patient reports dysphagia and left sided chest pain.  She will come in and see Ellouise Newer, [place] on 06/28/17 1:15.  She was seen in the ED yesterday and started on carafate.

## 2017-06-21 DIAGNOSIS — Z6830 Body mass index (BMI) 30.0-30.9, adult: Secondary | ICD-10-CM | POA: Diagnosis not present

## 2017-06-21 DIAGNOSIS — K219 Gastro-esophageal reflux disease without esophagitis: Secondary | ICD-10-CM | POA: Diagnosis not present

## 2017-06-21 DIAGNOSIS — R079 Chest pain, unspecified: Secondary | ICD-10-CM | POA: Diagnosis not present

## 2017-06-28 ENCOUNTER — Encounter: Payer: Self-pay | Admitting: Physician Assistant

## 2017-06-28 ENCOUNTER — Ambulatory Visit (INDEPENDENT_AMBULATORY_CARE_PROVIDER_SITE_OTHER): Payer: PPO | Admitting: Physician Assistant

## 2017-06-28 VITALS — BP 112/72 | HR 80 | Ht 61.0 in | Wt 163.2 lb

## 2017-06-28 DIAGNOSIS — R1310 Dysphagia, unspecified: Secondary | ICD-10-CM | POA: Diagnosis not present

## 2017-06-28 DIAGNOSIS — K219 Gastro-esophageal reflux disease without esophagitis: Secondary | ICD-10-CM | POA: Diagnosis not present

## 2017-06-28 DIAGNOSIS — R1013 Epigastric pain: Secondary | ICD-10-CM | POA: Diagnosis not present

## 2017-06-28 NOTE — Patient Instructions (Addendum)
We have sent the following medications to your pharmacy for you to pick up at your convenience: Ana Gutierrez, Ana Gutierrez, Ana Gutierrez.  1. Dexilant 60 mg.   We have given you an Good RX card to present to the pharmacy. Have them run the prescription as Cash instead of running your insurance.   You have been scheduled for an endoscopy. Please follow written instructions given to you at your visit today. If you use inhalers (even only as needed), please bring them with you on the day of your procedure. Your physician has requested that you go to www.startemmi.com and enter the access code given to you at your visit today. This web site gives a general overview about your procedure. However, you should still follow specific instructions given to you by our office regarding your preparation for the procedure.   If you are age 85 or older, your body mass index should be between 23-30. Your Body mass index is 30.84 kg/m. If this is out of the aforementioned range listed, please consider follow up with your Primary Care Provider.

## 2017-06-28 NOTE — Progress Notes (Addendum)
Chief Complaint: Dysphagia, Epigastric Pain  HPI:  Ana Gutierrez is a 71 year old Caucasian female with a past medical history as listed below, who presents to clinic for a complaint of epigastric pain and dysphagia in follow up from ER .      Patient is known to Dr. Carlean Purl, her last EGD was performed 04/28/10 and was normal.   Patient recently presented to the ED on 06/19/17 with complaint of abdominal pain/chest pain which started the day before while gardening. This had been unrelieved with Gaviscon. Due to it being constant over the past 2 days and radiated into her left shoulder and back. Labs at that time showed a white count minimally elevated 11.5. Troponins were normal as well as a BMP and EKG was unchanged from previous.   Today, the patient tells me that she was out gardening on 06/19/17 and felt a heartburn/chest pain which then started to radiate into her left arm. She tells me that her son-in-law told her this could be a heart attack and after this pain continued for a day she proceeded to the ER with results as above. Patient notes that she has "not been as good as I should be" about taking her Dexilant 60 mg once daily, in fact she only takes this when she does have heartburn. She has been using this daily since being seen in the ER as well as Carafate 3-4 times a day as prescribed there. She has had no further episodes of pain but does tell me that sometimes when she swallows this "hurts a lot".   Patient tells me she has been scheduled for a cardiac workup including a stress test. She asks if this is necessary today. She denies any pain on exertion. She does have a strong family history of heart disease in her mother who had open-heart surgery for what sounds like CAD.   Patient denies fever, chills, blood in her stool, melena, weight loss, fatigue, anorexia, change in bowel habits, nausea or vomiting.  Past Medical History:  Diagnosis Date  . Anxiety   . Elevated lipase 05/20/2015  .  Hyperlipidemia   . Pancreas divisum 05/20/2015  . Thyroid disease     Past Surgical History:  Procedure Laterality Date  . ADRENALECTOMY Right 05/22/14  . BRAVO [place] STUDY  2011   negative  . COLONOSCOPY    . ESOPHAGOGASTRODUODENOSCOPY    . TUBAL LIGATION      Current Outpatient Prescriptions  Medication Sig Dispense Refill  . b complex vitamins tablet Take 1 tablet by mouth daily.    . clindamycin (CLEOCIN) 300 MG capsule Take 300 mg by mouth 3 (three) times daily. For 14 days (started 06/15/17)    . Coenzyme Q10 (CO Q 10) 100 MG CAPS Take 100 mg by mouth daily.     Marland Kitchen dexlansoprazole (DEXILANT) 60 MG capsule Take 60 mg by mouth daily.    . fish oil-omega-3 fatty acids 1000 MG capsule Take 2 g by mouth daily.     Marland Kitchen levothyroxine (SYNTHROID, LEVOTHROID) 112 MCG tablet Take 112 mcg by mouth daily before breakfast.    . Multiple Vitamin (MULTIVITAMIN) tablet Take 1 tablet by mouth daily.    . sucralfate (CARAFATE) 1 GM/10ML suspension Take 10 mLs (1 g total) by mouth 4 (four) times daily -  with meals and at bedtime. 420 mL 0   No current facility-administered medications for this visit.     Allergies as of 06/28/2017 - Review Complete 06/28/2017  Allergen Reaction  Noted  . Crestor [rosuvastatin] Other (See Comments) 01/28/2015  . Lipitor [atorvastatin]  06/30/2015  . Metoprolol tartrate Other (See Comments) 01/28/2015  . Penicillins Other (See Comments) 02/14/2012    Family History  Problem Relation Age of Onset  . Diabetes Mother   . Heart disease Mother   . Diabetes Maternal Aunt   . Colon cancer Maternal Grandfather   . Stomach cancer Neg Hx     Social History   Social History  . Marital status: Married    Spouse name: N/A  . Number of children: 2  . Years of education: N/A   Occupational History  . work force    Social History Main Topics  . Smoking status: Former Smoker    Types: Cigarettes    Quit date: 12/21/2007  . Smokeless tobacco: Never Used     Comment:  Quit in 2009  . Alcohol use No  . Drug use: No  . Sexual activity: Yes    Partners: Male    Birth control/ protection: Post-menopausal   Other Topics Concern  . Not on file   Social History Narrative  . No narrative on file    Review of Systems:    Constitutional: No weight loss, fever or chills Cardiovascular: Positive for chest pain   Respiratory: No SOB Gastrointestinal: See HPI and otherwise negative   Physical Exam:  Vital signs: BP 112/72   Pulse 80   Ht 5\' 1"  (1.549 m)   Wt 163 lb 3.2 oz (74 kg)   BMI 30.84 kg/m   Constitutional:   Pleasant overweight Caucasian female appears to be in NAD, Well developed, Well nourished, alert and cooperative Respiratory: Respirations even and unlabored. Lungs clear to auscultation bilaterally.   No wheezes, crackles, or rhonchi.  Cardiovascular: Normal S1, S2. No MRG. Regular rate and rhythm. No peripheral edema, cyanosis or pallor.  Gastrointestinal:  Soft, nondistended, nontender. No rebound or guarding. Normal bowel sounds. No appreciable masses or hepatomegaly. Rectal:  Not performed.  Psychiatric:  Demonstrates good judgement and reason without abnormal affect or behaviors.  MOST RECENT LABS AND IMAGING: CBC    Component Value Date/Time   WBC 11.5 (H) 06/19/2017 2109   RBC 4.35 06/19/2017 2109   HGB 13.6 06/19/2017 2109   HCT 41.9 06/19/2017 2109   PLT 259 06/19/2017 2109   MCV 96.3 06/19/2017 2109   MCH 31.3 06/19/2017 2109   MCHC 32.5 06/19/2017 2109   RDW 14.2 06/19/2017 2109   LYMPHSABS 2.4 08/15/2007 1935   MONOABS 0.5 08/15/2007 1935   EOSABS 0.1 08/15/2007 1935   BASOSABS 0.0 08/15/2007 1935    CMP     Component Value Date/Time   NA 139 06/19/2017 2109   K 4.0 06/19/2017 2109   CL 106 06/19/2017 2109   CO2 22 06/19/2017 2109   GLUCOSE 91 06/19/2017 2109   BUN 13 06/19/2017 2109   CREATININE 0.91 06/19/2017 2109   CALCIUM 9.0 06/19/2017 2109   PROT 7.2 05/15/2015 1525   ALBUMIN 4.4 05/15/2015 1525     AST 16 05/15/2015 1525   ALT 19 05/15/2015 1525   ALKPHOS 60 05/15/2015 1525   BILITOT 0.2 05/15/2015 1525   GFRNONAA >60 06/19/2017 2109   GFRAA >60 06/19/2017 2109    Assessment: 1. Epigastric pain: Severe episode which sent the patient to the ED at the beginning of this month, history of using some ibuprofen more frequently for a shoulder injury as well as recent prednisone usage for sinusitis; consider most likely  gastritis plus/minus esophagitis versus PUD versus other 2. Odynophagia: Patient describes some pain when she swallows, denies any overt dysphagia, likely esophagitis versus other  Plan: 1. Scheduled patient for an EGD for further evaluation of recent severe epigastric pain/odynophagia. Discussed risks, benefits, limitations and alternatives and the patient agrees to proceed. This was scheduled with Dr. [facility]lean [facility]. 2. Recommend the patient stay on her Dexilant 60 mg once daily. 3. Patient may discontinue Carafate at this time as this is difficult for her to take, she explains an increase in symptoms she can restart/use when necessary. 4. Recommend the patient continue with her cardiac evaluation as scheduled by her primary care provider. 5. Patient to follow in clinic per recommendations from Dr. Carlean Purl after time of procedure.  Ana Newer, PA-C [facility] 06/28/2017, 3:36 PM  Cc: Redmond School, MD   EGD is setup after cardiac evaluation which could change timing of EGD  Agree with Ms. Mort Sawyers evaluation and management.  Gatha Mayer, MD, [facility]

## 2017-07-11 DIAGNOSIS — G44219 Episodic tension-type headache, not intractable: Secondary | ICD-10-CM | POA: Diagnosis not present

## 2017-07-11 DIAGNOSIS — M9901 Segmental and somatic dysfunction of cervical region: Secondary | ICD-10-CM | POA: Diagnosis not present

## 2017-07-13 DIAGNOSIS — M9901 Segmental and somatic dysfunction of cervical region: Secondary | ICD-10-CM | POA: Diagnosis not present

## 2017-07-13 DIAGNOSIS — G44219 Episodic tension-type headache, not intractable: Secondary | ICD-10-CM | POA: Diagnosis not present

## 2017-07-15 ENCOUNTER — Telehealth: Payer: Self-pay | Admitting: Internal Medicine

## 2017-07-15 NOTE — Telephone Encounter (Signed)
Patient has been rescheduled to 07/19/17 8:00 am.  She understands to be NPO after midnight and arrive at 7:00

## 2017-07-19 ENCOUNTER — Encounter: Payer: Self-pay | Admitting: Internal Medicine

## 2017-07-19 ENCOUNTER — Ambulatory Visit: Payer: PPO | Admitting: Cardiology

## 2017-07-19 ENCOUNTER — Ambulatory Visit (AMBULATORY_SURGERY_CENTER): Payer: PPO | Admitting: Internal Medicine

## 2017-07-19 VITALS — BP 121/65 | HR 67 | Temp 97.7°F | Resp 14 | Ht 61.0 in | Wt 163.0 lb

## 2017-07-19 DIAGNOSIS — R1013 Epigastric pain: Secondary | ICD-10-CM | POA: Diagnosis not present

## 2017-07-19 DIAGNOSIS — K219 Gastro-esophageal reflux disease without esophagitis: Secondary | ICD-10-CM

## 2017-07-19 MED ORDER — SODIUM CHLORIDE 0.9 % IV SOLN: 500.0000 mL | INTRAVENOUS | Status: DC

## 2017-07-19 MED ORDER — PANTOPRAZOLE SODIUM 40 MG PO TBEC: 40.0000 mg | DELAYED_RELEASE_TABLET | Freq: Two times a day (BID) | ORAL | 11 refills | Status: AC

## 2017-07-19 NOTE — Progress Notes (Signed)
Pt to PACU-- AW patent--- VSS---- Report to RN 

## 2017-07-19 NOTE — Patient Instructions (Addendum)
Things looked ok here - I do think its possible you had a spasm of the esophagus causing the symptoms that sent you to the emergency department. No ulcers, cancers or inflammation.  I do think you need to take the acid blocking medicine every day and have sent a prescription for pantoprazole twice a day as we discussed.  Let's see how it goes - if you do not continue to get better call me back - we may need to evaluate the esophagus to see how it squeezes.  I appreciate the opportunity to care for you. Gatha Mayer, MD, FACG YOU HAD AN ENDOSCOPIC PROCEDURE TODAY AT [facility]:   Refer to the procedure report that was given to you for any specific questions about what was found during the examination.  If the procedure report does not answer your questions, please call your gastroenterologist to clarify.  If you requested that your care partner not be given the details of your procedure findings, then the procedure report has been included in a sealed envelope for you to review at your convenience later.  YOU SHOULD EXPECT: Some feelings of bloating in the abdomen. Passage of more gas than usual.  Walking can help get rid of the air that was put into your GI tract during the procedure and reduce the bloating. If you had a lower endoscopy (such as a colonoscopy or flexible sigmoidoscopy) you may notice spotting of blood in your stool or on the toilet paper. If you underwent a bowel prep for your procedure, you may not have a normal bowel movement for a few days.  Please Note:  You might notice some irritation and congestion in your nose or some drainage.  This is from the oxygen used during your procedure.  There is no need for concern and it should clear up in a day or so.  SYMPTOMS TO REPORT IMMEDIATELY:   Following lower endoscopy (colonoscopy or flexible sigmoidoscopy):  Excessive amounts of blood in the stool  Significant tenderness or worsening of abdominal  pains  Swelling of the abdomen that is new, acute  Fever of 100F or higher   Following upper endoscopy (EGD)  Vomiting of blood or coffee ground material  New chest pain or pain under the shoulder blades  Painful or persistently difficult swallowing  New shortness of breath  Fever of 100F or higher  Black, tarry-looking stools  For urgent or emergent issues, a gastroenterologist can be reached at any hour by calling [phone].   DIET:  We do recommend a small meal at first, but then you may proceed to your regular diet.  Drink plenty of fluids but you should avoid alcoholic beverages for 24 hours.  ACTIVITY:  You should plan to take it easy for the rest of today and you should NOT DRIVE or use heavy machinery until tomorrow (because of the sedation medicines used during the test).    FOLLOW UP: Our staff will call the number listed on your records the next business day following your procedure to check on you and address any questions or concerns that you may have regarding the information given to you following your procedure. If we do not reach you, we will leave a message.  However, if you are feeling well and you are not experiencing any problems, there is no need to return our call.  We will assume that you have returned to your regular daily activities without incident.  If any biopsies were  taken you will be contacted by phone or by letter within the next 1-3 weeks.  Please call us at [phone] if you have not heard about the biopsies in 3 weeks.    SIGNATURES/CONFIDENTIALITY: You and/or your care partner have signed paperwork which will be entered into your electronic medical record.  These signatures attest to the fact that that the information above on your After Visit Summary has been reviewed and is understood.  Full responsibility of the confidentiality of this discharge information lies with you and/or your care-partner.YOU HAD AN ENDOSCOPIC PROCEDURE TODAY AT [facility]:   Refer to the procedure report that was given to you for any specific questions about what was found during the examination.  If the procedure report does not answer your questions, please call your gastroenterologist to clarify.  If you requested that your care partner not be given the details of your procedure findings, then the procedure report has been included in a sealed envelope for you to review at your convenience later.  YOU SHOULD EXPECT: Some feelings of bloating in the abdomen. Passage of more gas than usual.  Walking can help get rid of the air that was put into your GI tract during the procedure and reduce the bloating. If you had a lower endoscopy (such as a colonoscopy or flexible sigmoidoscopy) you may notice spotting of blood in your stool or on the toilet paper. If you underwent a bowel prep for your procedure, you may not have a normal bowel movement for a few days.  Please Note:  You might notice some irritation and congestion in your nose or some drainage.  This is from the oxygen used during your procedure.  There is no need for concern and it should clear up in a day or so.  SYMPTOMS TO REPORT IMMEDIATELY:   Following lower endoscopy (colonoscopy or flexible sigmoidoscopy):  Excessive amounts of blood in the stool  Significant tenderness or worsening of abdominal pains  Swelling of the abdomen that is new, acute  Fever of 100F or higher   Following upper endoscopy (EGD)  Vomiting of blood or coffee ground material  New chest pain or pain under the shoulder blades  Painful or persistently difficult swallowing  New shortness of breath  Fever of 100F or higher  Black, tarry-looking stools  For urgent or emergent issues, a gastroenterologist can be reached at any hour by calling [phone].   DIET:  We do recommend a small meal at first, but then you may proceed to your regular diet.  Drink plenty of fluids but you should avoid  alcoholic beverages for 24 hours.  ACTIVITY:  You should plan to take it easy for the rest of today and you should NOT DRIVE or use heavy machinery until tomorrow (because of the sedation medicines used during the test).    FOLLOW UP: Our staff will call the number listed on your records the next business day following your procedure to check on you and address any questions or concerns that you may have regarding the information given to you following your procedure. If we do not reach you, we will leave a message.  However, if you are feeling well and you are not experiencing any problems, there is no need to return our call.  We will assume that you have returned to your regular daily activities without incident.  If any biopsies were taken you will be contacted by phone or by letter within the  next 1-3 weeks.  Please call us at [phone] if you have not heard about the biopsies in 3 weeks.    SIGNATURES/CONFIDENTIALITY: You and/or your care partner have signed paperwork which will be entered into your electronic medical record.  These signatures attest to the fact that that the information above on your After Visit Summary has been reviewed and is understood.  Full responsibility of the confidentiality of this discharge information lies with you and/or your care-partner.  Dr. Carlean Purl says that you don't to take Zantac unless you need it.

## 2017-07-19 NOTE — Op Note (Addendum)
[facility] Patient Name: Ana Gutierrez Procedure Date: 07/19/2017 7:53 AM MRN: [id number] Endoscopist: Gatha Mayer , MD Age: 71 Referring MD:  Date of Birth: 03/08/1946 Gender: Female Account #: [id number] Procedure:                Upper GI endoscopy Indications:              Odynophagia, Heartburn, Gastro-esophageal reflux                            disease Medicines:                Propofol per Anesthesia, Monitored Anesthesia Care Procedure:                Pre-Anesthesia Assessment:                           - Prior to the procedure, a History and Physical                            was performed, and patient medications and                            allergies were reviewed. The patient's tolerance of                            previous anesthesia was also reviewed. The risks                            and benefits of the procedure and the sedation                            options and risks were discussed with the patient.                            All questions were answered, and informed consent                            was obtained. Prior Anticoagulants: The patient has                            taken no previous anticoagulant or antiplatelet                            agents. ASA Grade Assessment: II - A patient with                            mild systemic disease. After reviewing the risks                            and benefits, the patient was deemed in                            satisfactory condition to undergo the procedure.  After obtaining informed consent, the endoscope was                            passed under direct vision. Throughout the                            procedure, the patient's blood pressure, pulse, and                            oxygen saturations were monitored continuously. The                            Endoscope was introduced through the mouth, and                            advanced to the second  part of duodenum. The upper                            GI endoscopy was accomplished without difficulty.                            The patient tolerated the procedure well. Scope In: Scope Out: Findings:                 The esophagus was normal.                           The stomach was normal.                           The examined duodenum was normal.                           The cardia and gastric fundus were normal on                            retroflexion. Complications:            No immediate complications. Estimated Blood Loss:     Estimated blood loss: none. Impression:               - Normal esophagus.                           - Normal stomach.                           - Normal examined duodenum.                           - No specimens collected. Recommendation:           - Patient has a contact number available for                            emergencies. The signs and symptoms of potential  delayed complications were discussed with the                            patient. Return to normal activities tomorrow.                            Written discharge instructions were provided to the                            patient.                           - Resume previous diet.                           - Continue present medications.                           - Sxs are better if they fail to resolve would                            consider ba swallow and/or esophageal manometry. Gatha Mayer, MD 07/19/2017 8:17:44 AM This report has been signed electronically.

## 2017-07-20 ENCOUNTER — Telehealth: Payer: Self-pay | Admitting: *Deleted

## 2017-07-20 ENCOUNTER — Telehealth: Payer: Self-pay

## 2017-07-20 NOTE — Telephone Encounter (Signed)
Message left

## 2017-07-20 NOTE — Telephone Encounter (Signed)
Attempted to reach patient for post-procedure f/u call. Line busy. A staff member will attempt to reach her later today.

## 2017-08-05 ENCOUNTER — Encounter: Payer: PPO | Admitting: Internal Medicine

## 2017-08-11 ENCOUNTER — Encounter: Payer: Self-pay | Admitting: Cardiology

## 2017-08-11 NOTE — Progress Notes (Signed)
Cardiology Office Note  Date: 08/12/2017   ID: Ana, Gutierrez Sep 14, 1946, MRN [id number]  PCP: Redmond School, MD  Consulting Cardiologist: Rozann Lesches, MD   Chief Complaint  Patient presents with  . Chest Pain    History of Present Illness: Ana Gutierrez is a 71 y.o. female referred for cardiology consultation by Dr. Gerarda Fraction for the evaluation of chest pain. We discussed her symptoms. She tells me that she developed chest discomfort after having a shake for breakfast one morning and then working out in her garden. She described is as intense indigestion. Later the next day she was having frank dysphagia as well and continued to have indigestion symptoms. Her son encouraged her to go the ER for evaluation. Troponin I level was negative. She does have a history of recurring reflux disease. Patient just recently underwent an EGD by Dr. Carlean Purl on July 31 revealing no acute findings.  She reports undergoing a cardiac catheterization with Dr. Rollene Fare about 5 years ago that was reassuring, prior to that had a negative stress test in 2003 as detailed below.  Cardiac risk factors include hyperlipidemia with statin intolerance. Her most recent LDL was in the 180s. She has been trying to walk regularly to bring down her lipid numbers. Her mother also has a history of heart disease. I reviewed her recent ECG as noted below which showed nonspecific ST segment changes.  Past Medical History:  Diagnosis Date  . Anxiety   . Elevated lipase 05/20/2015  . GERD (gastroesophageal reflux disease)   . Hyperlipidemia   . Hypothyroidism   . Pancreas divisum 05/20/2015    Past Surgical History:  Procedure Laterality Date  . ADRENALECTOMY Right 05/22/14  . BRAVO [place] STUDY  2011   negative  . COLONOSCOPY    . ESOPHAGOGASTRODUODENOSCOPY    . TUBAL LIGATION    . UPPER GASTROINTESTINAL ENDOSCOPY      Current Outpatient Prescriptions  Medication Sig Dispense Refill  . b complex  vitamins tablet Take 1 tablet by mouth daily.    . Coenzyme Q10 (CO Q 10) 100 MG CAPS Take 100 mg by mouth daily.     Marland Kitchen DEXILANT 60 MG capsule Take 60 mg by mouth daily. Patient instructed to take medication until finished. 10 tablets left.    . fish oil-omega-3 fatty acids 1000 MG capsule Take 2 g by mouth daily.     Marland Kitchen levothyroxine (SYNTHROID, LEVOTHROID) 112 MCG tablet Take 112 mcg by mouth daily before breakfast.    . Multiple Vitamin (MULTIVITAMIN) tablet Take 1 tablet by mouth daily.    . pantoprazole (PROTONIX) 40 MG tablet Take 1 tablet (40 mg total) by mouth 2 (two) times daily before a meal. Before breakfast + supper 60 tablet 11   No current facility-administered medications for this visit.    Allergies:  Crestor [rosuvastatin]; Lipitor [atorvastatin]; Metoprolol tartrate; and Penicillins   Social History: The patient  reports that she quit smoking about 9 years ago. Her smoking use included Cigarettes. She has never used smokeless tobacco. She reports that she does not drink alcohol or use drugs.   Family History: The patient's family history includes Colon cancer in her maternal grandfather; Diabetes in her maternal aunt and mother; Heart disease in her mother.   ROS:  Please see the history of present illness. Otherwise, complete review of systems is positive for arthritic pains.  All other systems are reviewed and negative.   Physical Exam: VS:  BP 128/68 (BP Location:  Left Arm)   Pulse 70   Ht 5\' 1"  (1.549 m)   Wt 164 lb 6.4 oz (74.6 kg)   LMP  (LMP Unknown)   BMI 31.06 kg/m , BMI Body mass index is 31.06 kg/m.  Wt Readings from Last 3 Encounters:  08/12/17 164 lb 6.4 oz (74.6 kg)  07/19/17 163 lb (73.9 kg)  06/28/17 163 lb 3.2 oz (74 kg)    General: Overweight woman, appears comfortable at rest. HEENT: Conjunctiva and lids normal, oropharynx clear. Neck: Supple, no elevated JVP or carotid bruits, no thyromegaly. Lungs: Clear to auscultation, nonlabored breathing  at rest. Cardiac: Regular rate and rhythm, no S3, 2/6 systolic murmur, no pericardial rub. Abdomen: Soft, nontender, bowel sounds present, no guarding or rebound. Extremities: No pitting edema, distal pulses 2+. Skin: Warm and dry. Musculoskeletal: No kyphosis. Neuropsychiatric: Alert and oriented x3, affect grossly appropriate.  ECG: I personally reviewed the tracing from 06/19/2017 which showed normal sinus rhythm with nonspecific ST changes.  Recent Labwork: 06/19/2017: BUN 13; Creatinine, Ser 0.91; Hemoglobin 13.6; Platelets 259; Potassium 4.0; Sodium 139  June 2018: Cholesterol 252, triglycerides 142, HDL 40, LDL 184 January 2018: Hemoglobin A1c 6%  Other Studies Reviewed Today:  Exercise Cardiolite 08/07/2002: IMPRESSION 1.  NO DEFINITE EVIDENCE OF EXERCISE INDUCED MYOCARDIAL ISCHEMIA. 2.  NEGATIVE STRESS EXERCISE TEST AT 91% OF THE MAXIMUM PREDICTED HEART RATE AND MAXIMUM WORK LOAD OF 7.0 METS. 3.  NORMAL LEFT VENTRICULAR EJECTION FRACTION OF 64% WITH NO FOCAL WALL MOTION ABNORMALITIES.  Chest x-ray 06/19/2017: FINDINGS: Scarring is seen in the left lung base. The heart, hila, mediastinum, lungs, and pleura are otherwise normal.  IMPRESSION: No active cardiopulmonary disease.  Assessment and Plan:  1. Chest pain, largely atypical in description and possibly gastrointestinal in etiology. She does have cardiac risk factors including uncontrolled hyperlipidemia and family history of CAD in her mother. She quit smoking cigarettes several years ago. She has not undergone ischemic evaluation in the last 5 years and we discussed obtaining an exercise Myoview for objective evaluation.  2. Heart murmur, echocardiogram will be obtained.  3. Uncontrolled hyperlipidemia with statin intolerance. She could consider starting Zetia 10 mg daily and continuing with walking regimen and follow-up lipids per Dr. Gerarda Fraction. Praluent would be another option if covered by her insurance.  4. Recurrent  reflux, on Protonix.  Current medicines were reviewed with the patient today.   Orders Placed This Encounter  Procedures  . NM Myocar Multi W/Spect W/Wall Motion / EF  . ECHOCARDIOGRAM COMPLETE    Disposition: Call with test results.  Signed, Satira Sark, MD, [facility] 08/12/2017 11:20 AM    [facility]. [address], [place], [place] [address] Phone: [phone]; Fax: [phone]

## 2017-08-12 ENCOUNTER — Encounter: Payer: Self-pay | Admitting: *Deleted

## 2017-08-12 ENCOUNTER — Ambulatory Visit (INDEPENDENT_AMBULATORY_CARE_PROVIDER_SITE_OTHER): Payer: PPO | Admitting: Cardiology

## 2017-08-12 ENCOUNTER — Encounter: Payer: Self-pay | Admitting: Cardiology

## 2017-08-12 VITALS — BP 128/68 | HR 70 | Ht 61.0 in | Wt 164.4 lb

## 2017-08-12 DIAGNOSIS — K219 Gastro-esophageal reflux disease without esophagitis: Secondary | ICD-10-CM

## 2017-08-12 DIAGNOSIS — R0789 Other chest pain: Secondary | ICD-10-CM

## 2017-08-12 DIAGNOSIS — E782 Mixed hyperlipidemia: Secondary | ICD-10-CM

## 2017-08-12 DIAGNOSIS — R011 Cardiac murmur, unspecified: Secondary | ICD-10-CM | POA: Diagnosis not present

## 2017-08-12 DIAGNOSIS — F17201 Nicotine dependence, unspecified, in remission: Secondary | ICD-10-CM | POA: Diagnosis not present

## 2017-08-12 NOTE — Patient Instructions (Signed)
Medication Instructions:  Your physician recommends that you continue on your current medications as directed. Please refer to the Current Medication list given to you today.   Labwork: NONE  Testing/Procedures: Your physician has requested that you have en exercise stress myoview. For further information please visit [email or web address]. Please follow instruction sheet, as given.  Your physician has requested that you have an echocardiogram. Echocardiography is a painless test that uses sound waves to create images of your heart. It provides your doctor with information about the size and shape of your heart and how well your heart's chambers and valves are working. This procedure takes approximately one hour. There are no restrictions for this procedure.    Follow-Up: We Will call you will your test results    Any Other Special Instructions Will Be Listed Below (If Applicable).     If you need a refill on your cardiac medications before your next appointment, please call your pharmacy. Thank you for choosing [facility]!

## 2017-08-17 ENCOUNTER — Ambulatory Visit: Payer: PPO | Admitting: Internal Medicine

## 2017-08-23 ENCOUNTER — Encounter (HOSPITAL_COMMUNITY): Payer: Self-pay

## 2017-08-23 ENCOUNTER — Encounter (HOSPITAL_COMMUNITY)
Admission: RE | Admit: 2017-08-23 | Discharge: 2017-08-23 | Disposition: A | Discharge status: Home / Self Care | Payer: PPO | Source: Ambulatory Visit | Attending: Cardiology | Admitting: Cardiology

## 2017-08-23 ENCOUNTER — Ambulatory Visit (HOSPITAL_COMMUNITY)
Admission: RE | Admit: 2017-08-23 | Discharge: 2017-08-23 | Disposition: A | Discharge status: Home / Self Care | Payer: PPO | Source: Ambulatory Visit | Attending: Cardiology | Admitting: Cardiology

## 2017-08-23 ENCOUNTER — Encounter (HOSPITAL_BASED_OUTPATIENT_CLINIC_OR_DEPARTMENT_OTHER)
Admission: RE | Admit: 2017-08-23 | Discharge: 2017-08-23 | Disposition: A | Discharge status: Home / Self Care | Payer: PPO | Source: Ambulatory Visit | Attending: Cardiology | Admitting: Cardiology

## 2017-08-23 DIAGNOSIS — E785 Hyperlipidemia, unspecified: Secondary | ICD-10-CM | POA: Insufficient documentation

## 2017-08-23 DIAGNOSIS — I5189 Other ill-defined heart diseases: Secondary | ICD-10-CM | POA: Insufficient documentation

## 2017-08-23 DIAGNOSIS — R0789 Other chest pain: Secondary | ICD-10-CM

## 2017-08-23 DIAGNOSIS — Z87891 Personal history of nicotine dependence: Secondary | ICD-10-CM | POA: Insufficient documentation

## 2017-08-23 DIAGNOSIS — K219 Gastro-esophageal reflux disease without esophagitis: Secondary | ICD-10-CM | POA: Diagnosis not present

## 2017-08-23 DIAGNOSIS — E039 Hypothyroidism, unspecified: Secondary | ICD-10-CM | POA: Insufficient documentation

## 2017-08-23 DIAGNOSIS — R011 Cardiac murmur, unspecified: Secondary | ICD-10-CM | POA: Diagnosis not present

## 2017-08-23 LAB — NM MYOCAR MULTI W/SPECT W/WALL MOTION / EF
CHL CUP MPHR: 149 {beats}/min
CHL CUP NUCLEAR SDS: 1
CHL CUP NUCLEAR SRS: 2
CHL CUP NUCLEAR SSS: 3
CSEPED: 4 min
CSEPEW: 7 METS
CSEPPHR: 148 {beats}/min
Exercise duration (sec): 22 s
LHR: 0.38
LV dias vol: 55 mL (ref 46–106)
LV sys vol: 17 mL
NUC STRESS TID: 0.98
Percent HR: 99 %
RPE: 13
Rest HR: 66 {beats}/min

## 2017-08-23 MED ORDER — SODIUM CHLORIDE 0.9% FLUSH
INTRAVENOUS | Status: AC
  Administered 2017-08-23: 10 mL via INTRAVENOUS
  Filled 2017-08-23: qty 10

## 2017-08-23 MED ORDER — TECHNETIUM TC 99M TETROFOSMIN IV KIT
30.0000 | PACK | Freq: Once | INTRAVENOUS | Status: AC | PRN
  Administered 2017-08-23: 32 via INTRAVENOUS

## 2017-08-23 MED ORDER — REGADENOSON 0.4 MG/5ML IV SOLN
INTRAVENOUS | Status: AC
  Filled 2017-08-23: qty 5

## 2017-08-23 MED ORDER — TECHNETIUM TC 99M TETROFOSMIN IV KIT
10.0000 | PACK | Freq: Once | INTRAVENOUS | Status: AC | PRN
  Administered 2017-08-23: 11 via INTRAVENOUS

## 2017-08-23 NOTE — Progress Notes (Signed)
*  PRELIMINARY RESULTS* Echocardiogram 2D Echocardiogram has been performed.  Leavy Cella 08/23/2017, 9:16 AM

## 2017-09-23 DIAGNOSIS — Z23 Encounter for immunization: Secondary | ICD-10-CM | POA: Diagnosis not present

## 2017-10-07 DIAGNOSIS — J069 Acute upper respiratory infection, unspecified: Secondary | ICD-10-CM | POA: Diagnosis not present

## 2017-10-07 DIAGNOSIS — Z6831 Body mass index (BMI) 31.0-31.9, adult: Secondary | ICD-10-CM | POA: Diagnosis not present

## 2017-10-07 DIAGNOSIS — E6609 Other obesity due to excess calories: Secondary | ICD-10-CM | POA: Diagnosis not present

## 2017-10-07 DIAGNOSIS — Z1389 Encounter for screening for other disorder: Secondary | ICD-10-CM | POA: Diagnosis not present

## 2017-10-07 DIAGNOSIS — J029 Acute pharyngitis, unspecified: Secondary | ICD-10-CM | POA: Diagnosis not present

## 2017-11-04 DIAGNOSIS — Z0001 Encounter for general adult medical examination with abnormal findings: Secondary | ICD-10-CM | POA: Diagnosis not present

## 2017-11-04 DIAGNOSIS — E6609 Other obesity due to excess calories: Secondary | ICD-10-CM | POA: Diagnosis not present

## 2017-11-04 DIAGNOSIS — E782 Mixed hyperlipidemia: Secondary | ICD-10-CM | POA: Diagnosis not present

## 2017-11-04 DIAGNOSIS — E669 Obesity, unspecified: Secondary | ICD-10-CM | POA: Diagnosis not present

## 2017-11-04 DIAGNOSIS — R300 Dysuria: Secondary | ICD-10-CM | POA: Diagnosis not present

## 2017-11-04 DIAGNOSIS — Z1389 Encounter for screening for other disorder: Secondary | ICD-10-CM | POA: Diagnosis not present

## 2017-11-04 DIAGNOSIS — K219 Gastro-esophageal reflux disease without esophagitis: Secondary | ICD-10-CM | POA: Diagnosis not present

## 2017-11-04 DIAGNOSIS — N342 Other urethritis: Secondary | ICD-10-CM | POA: Diagnosis not present

## 2017-11-04 DIAGNOSIS — Z6831 Body mass index (BMI) 31.0-31.9, adult: Secondary | ICD-10-CM | POA: Diagnosis not present

## 2017-11-04 DIAGNOSIS — E063 Autoimmune thyroiditis: Secondary | ICD-10-CM | POA: Diagnosis not present

## 2017-11-16 DIAGNOSIS — L570 Actinic keratosis: Secondary | ICD-10-CM | POA: Diagnosis not present

## 2017-11-16 DIAGNOSIS — X32XXXD Exposure to sunlight, subsequent encounter: Secondary | ICD-10-CM | POA: Diagnosis not present

## 2017-12-01 DIAGNOSIS — E6609 Other obesity due to excess calories: Secondary | ICD-10-CM | POA: Diagnosis not present

## 2017-12-01 DIAGNOSIS — Z6831 Body mass index (BMI) 31.0-31.9, adult: Secondary | ICD-10-CM | POA: Diagnosis not present

## 2017-12-01 DIAGNOSIS — E063 Autoimmune thyroiditis: Secondary | ICD-10-CM | POA: Diagnosis not present

## 2017-12-01 DIAGNOSIS — M1991 Primary osteoarthritis, unspecified site: Secondary | ICD-10-CM | POA: Diagnosis not present

## 2017-12-01 DIAGNOSIS — J329 Chronic sinusitis, unspecified: Secondary | ICD-10-CM | POA: Diagnosis not present

## 2017-12-01 DIAGNOSIS — E669 Obesity, unspecified: Secondary | ICD-10-CM | POA: Diagnosis not present

## 2017-12-01 DIAGNOSIS — K219 Gastro-esophageal reflux disease without esophagitis: Secondary | ICD-10-CM | POA: Diagnosis not present

## 2018-02-13 DIAGNOSIS — E6609 Other obesity due to excess calories: Secondary | ICD-10-CM | POA: Diagnosis not present

## 2018-02-13 DIAGNOSIS — Z1389 Encounter for screening for other disorder: Secondary | ICD-10-CM | POA: Diagnosis not present

## 2018-02-13 DIAGNOSIS — K219 Gastro-esophageal reflux disease without esophagitis: Secondary | ICD-10-CM | POA: Diagnosis not present

## 2018-02-13 DIAGNOSIS — M1991 Primary osteoarthritis, unspecified site: Secondary | ICD-10-CM | POA: Diagnosis not present

## 2018-02-13 DIAGNOSIS — R5383 Other fatigue: Secondary | ICD-10-CM | POA: Diagnosis not present

## 2018-02-13 DIAGNOSIS — Z6831 Body mass index (BMI) 31.0-31.9, adult: Secondary | ICD-10-CM | POA: Diagnosis not present

## 2018-02-13 DIAGNOSIS — R7309 Other abnormal glucose: Secondary | ICD-10-CM | POA: Diagnosis not present

## 2018-02-13 DIAGNOSIS — E538 Deficiency of other specified B group vitamins: Secondary | ICD-10-CM | POA: Diagnosis not present

## 2018-02-13 DIAGNOSIS — E748 Other specified disorders of carbohydrate metabolism: Secondary | ICD-10-CM | POA: Diagnosis not present

## 2018-02-13 DIAGNOSIS — J329 Chronic sinusitis, unspecified: Secondary | ICD-10-CM | POA: Diagnosis not present

## 2018-02-21 DIAGNOSIS — H1013 Acute atopic conjunctivitis, bilateral: Secondary | ICD-10-CM | POA: Diagnosis not present

## 2018-02-21 DIAGNOSIS — H2513 Age-related nuclear cataract, bilateral: Secondary | ICD-10-CM | POA: Diagnosis not present

## 2018-02-22 ENCOUNTER — Other Ambulatory Visit (HOSPITAL_COMMUNITY): Payer: Self-pay | Admitting: Internal Medicine

## 2018-02-22 DIAGNOSIS — Z1231 Encounter for screening mammogram for malignant neoplasm of breast: Secondary | ICD-10-CM

## 2018-03-14 DIAGNOSIS — H25813 Combined forms of age-related cataract, bilateral: Secondary | ICD-10-CM | POA: Diagnosis not present

## 2018-03-14 DIAGNOSIS — H5200 Hypermetropia, unspecified eye: Secondary | ICD-10-CM | POA: Diagnosis not present

## 2018-04-03 DIAGNOSIS — Z0100 Encounter for examination of eyes and vision without abnormal findings: Secondary | ICD-10-CM | POA: Diagnosis not present

## 2018-05-17 DIAGNOSIS — Z6831 Body mass index (BMI) 31.0-31.9, adult: Secondary | ICD-10-CM | POA: Diagnosis not present

## 2018-05-17 DIAGNOSIS — E6609 Other obesity due to excess calories: Secondary | ICD-10-CM | POA: Diagnosis not present

## 2018-05-17 DIAGNOSIS — Z1389 Encounter for screening for other disorder: Secondary | ICD-10-CM | POA: Diagnosis not present

## 2018-05-17 DIAGNOSIS — H6121 Impacted cerumen, right ear: Secondary | ICD-10-CM | POA: Diagnosis not present

## 2018-05-24 DIAGNOSIS — J3089 Other allergic rhinitis: Secondary | ICD-10-CM | POA: Diagnosis not present

## 2018-05-24 DIAGNOSIS — H6983 Other specified disorders of Eustachian tube, bilateral: Secondary | ICD-10-CM | POA: Diagnosis not present

## 2018-05-24 DIAGNOSIS — H9313 Tinnitus, bilateral: Secondary | ICD-10-CM | POA: Diagnosis not present

## 2018-06-05 DIAGNOSIS — M545 Low back pain: Secondary | ICD-10-CM | POA: Diagnosis not present

## 2018-06-05 DIAGNOSIS — M9902 Segmental and somatic dysfunction of thoracic region: Secondary | ICD-10-CM | POA: Diagnosis not present

## 2018-06-05 DIAGNOSIS — M9903 Segmental and somatic dysfunction of lumbar region: Secondary | ICD-10-CM | POA: Diagnosis not present

## 2018-06-05 DIAGNOSIS — M546 Pain in thoracic spine: Secondary | ICD-10-CM | POA: Diagnosis not present

## 2018-06-05 DIAGNOSIS — M9905 Segmental and somatic dysfunction of pelvic region: Secondary | ICD-10-CM | POA: Diagnosis not present

## 2018-06-08 DIAGNOSIS — M9902 Segmental and somatic dysfunction of thoracic region: Secondary | ICD-10-CM | POA: Diagnosis not present

## 2018-06-08 DIAGNOSIS — M546 Pain in thoracic spine: Secondary | ICD-10-CM | POA: Diagnosis not present

## 2018-06-08 DIAGNOSIS — M9905 Segmental and somatic dysfunction of pelvic region: Secondary | ICD-10-CM | POA: Diagnosis not present

## 2018-06-08 DIAGNOSIS — M9903 Segmental and somatic dysfunction of lumbar region: Secondary | ICD-10-CM | POA: Diagnosis not present

## 2018-06-08 DIAGNOSIS — M545 Low back pain: Secondary | ICD-10-CM | POA: Diagnosis not present

## 2018-06-14 DIAGNOSIS — M9903 Segmental and somatic dysfunction of lumbar region: Secondary | ICD-10-CM | POA: Diagnosis not present

## 2018-06-14 DIAGNOSIS — M9902 Segmental and somatic dysfunction of thoracic region: Secondary | ICD-10-CM | POA: Diagnosis not present

## 2018-06-14 DIAGNOSIS — M546 Pain in thoracic spine: Secondary | ICD-10-CM | POA: Diagnosis not present

## 2018-06-14 DIAGNOSIS — M9905 Segmental and somatic dysfunction of pelvic region: Secondary | ICD-10-CM | POA: Diagnosis not present

## 2018-06-14 DIAGNOSIS — M545 Low back pain: Secondary | ICD-10-CM | POA: Diagnosis not present

## 2018-08-30 DIAGNOSIS — M1991 Primary osteoarthritis, unspecified site: Secondary | ICD-10-CM | POA: Diagnosis not present

## 2018-08-30 DIAGNOSIS — Z1389 Encounter for screening for other disorder: Secondary | ICD-10-CM | POA: Diagnosis not present

## 2018-08-30 DIAGNOSIS — E6609 Other obesity due to excess calories: Secondary | ICD-10-CM | POA: Diagnosis not present

## 2018-08-30 DIAGNOSIS — M545 Low back pain: Secondary | ICD-10-CM | POA: Diagnosis not present

## 2018-08-30 DIAGNOSIS — Z6831 Body mass index (BMI) 31.0-31.9, adult: Secondary | ICD-10-CM | POA: Diagnosis not present

## 2018-08-30 DIAGNOSIS — E278 Other specified disorders of adrenal gland: Secondary | ICD-10-CM | POA: Diagnosis not present

## 2018-08-30 DIAGNOSIS — L659 Nonscarring hair loss, unspecified: Secondary | ICD-10-CM | POA: Diagnosis not present

## 2018-08-30 DIAGNOSIS — Z23 Encounter for immunization: Secondary | ICD-10-CM | POA: Diagnosis not present

## 2018-08-30 DIAGNOSIS — E559 Vitamin D deficiency, unspecified: Secondary | ICD-10-CM | POA: Diagnosis not present

## 2018-11-03 DIAGNOSIS — M25551 Pain in right hip: Secondary | ICD-10-CM | POA: Diagnosis not present

## 2018-11-03 DIAGNOSIS — M545 Low back pain: Secondary | ICD-10-CM | POA: Diagnosis not present

## 2018-11-13 DIAGNOSIS — K219 Gastro-esophageal reflux disease without esophagitis: Secondary | ICD-10-CM | POA: Diagnosis not present

## 2018-11-13 DIAGNOSIS — E278 Other specified disorders of adrenal gland: Secondary | ICD-10-CM | POA: Diagnosis not present

## 2018-11-13 DIAGNOSIS — E063 Autoimmune thyroiditis: Secondary | ICD-10-CM | POA: Diagnosis not present

## 2018-11-13 DIAGNOSIS — M1991 Primary osteoarthritis, unspecified site: Secondary | ICD-10-CM | POA: Diagnosis not present

## 2018-11-13 DIAGNOSIS — E785 Hyperlipidemia, unspecified: Secondary | ICD-10-CM | POA: Diagnosis not present

## 2018-11-13 DIAGNOSIS — Z1389 Encounter for screening for other disorder: Secondary | ICD-10-CM | POA: Diagnosis not present

## 2018-11-13 DIAGNOSIS — M159 Polyosteoarthritis, unspecified: Secondary | ICD-10-CM | POA: Diagnosis not present

## 2018-11-13 DIAGNOSIS — Z6832 Body mass index (BMI) 32.0-32.9, adult: Secondary | ICD-10-CM | POA: Diagnosis not present

## 2018-11-13 DIAGNOSIS — Z0001 Encounter for general adult medical examination with abnormal findings: Secondary | ICD-10-CM | POA: Diagnosis not present

## 2018-11-30 ENCOUNTER — Other Ambulatory Visit (HOSPITAL_COMMUNITY): Payer: Self-pay | Admitting: Internal Medicine

## 2018-11-30 DIAGNOSIS — Z1231 Encounter for screening mammogram for malignant neoplasm of breast: Secondary | ICD-10-CM

## 2018-12-25 DIAGNOSIS — M9905 Segmental and somatic dysfunction of pelvic region: Secondary | ICD-10-CM | POA: Diagnosis not present

## 2018-12-25 DIAGNOSIS — M9903 Segmental and somatic dysfunction of lumbar region: Secondary | ICD-10-CM | POA: Diagnosis not present

## 2018-12-25 DIAGNOSIS — M9902 Segmental and somatic dysfunction of thoracic region: Secondary | ICD-10-CM | POA: Diagnosis not present

## 2018-12-25 DIAGNOSIS — M546 Pain in thoracic spine: Secondary | ICD-10-CM | POA: Diagnosis not present

## 2018-12-25 DIAGNOSIS — M5441 Lumbago with sciatica, right side: Secondary | ICD-10-CM | POA: Diagnosis not present

## 2018-12-28 ENCOUNTER — Encounter (HOSPITAL_COMMUNITY): Payer: Self-pay

## 2018-12-28 ENCOUNTER — Ambulatory Visit (HOSPITAL_COMMUNITY)
Admission: RE | Admit: 2018-12-28 | Discharge: 2018-12-28 | Disposition: A | Discharge status: Home / Self Care | Payer: Medicare HMO | Source: Ambulatory Visit | Attending: Internal Medicine | Admitting: Internal Medicine

## 2018-12-28 DIAGNOSIS — Z1231 Encounter for screening mammogram for malignant neoplasm of breast: Secondary | ICD-10-CM | POA: Diagnosis not present

## 2018-12-29 ENCOUNTER — Other Ambulatory Visit (HOSPITAL_COMMUNITY): Payer: Self-pay | Admitting: Internal Medicine

## 2018-12-29 DIAGNOSIS — R928 Other abnormal and inconclusive findings on diagnostic imaging of breast: Secondary | ICD-10-CM

## 2019-01-03 ENCOUNTER — Ambulatory Visit
Admission: RE | Admit: 2019-01-03 | Discharge: 2019-01-03 | Disposition: A | Discharge status: Home / Self Care | Payer: PPO | Source: Ambulatory Visit | Attending: Internal Medicine | Admitting: Internal Medicine

## 2019-01-03 ENCOUNTER — Ambulatory Visit
Admission: RE | Admit: 2019-01-03 | Discharge: 2019-01-03 | Disposition: A | Discharge status: Home / Self Care | Payer: Medicare HMO | Source: Ambulatory Visit | Attending: Internal Medicine | Admitting: Internal Medicine

## 2019-01-03 DIAGNOSIS — R928 Other abnormal and inconclusive findings on diagnostic imaging of breast: Secondary | ICD-10-CM

## 2019-01-03 DIAGNOSIS — N6002 Solitary cyst of left breast: Secondary | ICD-10-CM | POA: Diagnosis not present

## 2019-01-03 DIAGNOSIS — N6001 Solitary cyst of right breast: Secondary | ICD-10-CM | POA: Diagnosis not present

## 2019-01-03 DIAGNOSIS — R922 Inconclusive mammogram: Secondary | ICD-10-CM | POA: Diagnosis not present

## 2019-01-23 DIAGNOSIS — M5441 Lumbago with sciatica, right side: Secondary | ICD-10-CM | POA: Diagnosis not present

## 2019-01-23 DIAGNOSIS — M9903 Segmental and somatic dysfunction of lumbar region: Secondary | ICD-10-CM | POA: Diagnosis not present

## 2019-01-23 DIAGNOSIS — M9902 Segmental and somatic dysfunction of thoracic region: Secondary | ICD-10-CM | POA: Diagnosis not present

## 2019-01-23 DIAGNOSIS — M9905 Segmental and somatic dysfunction of pelvic region: Secondary | ICD-10-CM | POA: Diagnosis not present

## 2019-01-23 DIAGNOSIS — M546 Pain in thoracic spine: Secondary | ICD-10-CM | POA: Diagnosis not present

## 2019-01-25 DIAGNOSIS — M5441 Lumbago with sciatica, right side: Secondary | ICD-10-CM | POA: Diagnosis not present

## 2019-01-25 DIAGNOSIS — M9902 Segmental and somatic dysfunction of thoracic region: Secondary | ICD-10-CM | POA: Diagnosis not present

## 2019-01-25 DIAGNOSIS — M9903 Segmental and somatic dysfunction of lumbar region: Secondary | ICD-10-CM | POA: Diagnosis not present

## 2019-01-25 DIAGNOSIS — M9905 Segmental and somatic dysfunction of pelvic region: Secondary | ICD-10-CM | POA: Diagnosis not present

## 2019-01-25 DIAGNOSIS — M546 Pain in thoracic spine: Secondary | ICD-10-CM | POA: Diagnosis not present

## 2019-02-01 DIAGNOSIS — M5441 Lumbago with sciatica, right side: Secondary | ICD-10-CM | POA: Diagnosis not present

## 2019-02-01 DIAGNOSIS — M9905 Segmental and somatic dysfunction of pelvic region: Secondary | ICD-10-CM | POA: Diagnosis not present

## 2019-02-01 DIAGNOSIS — M9902 Segmental and somatic dysfunction of thoracic region: Secondary | ICD-10-CM | POA: Diagnosis not present

## 2019-02-01 DIAGNOSIS — M9903 Segmental and somatic dysfunction of lumbar region: Secondary | ICD-10-CM | POA: Diagnosis not present

## 2019-02-01 DIAGNOSIS — M546 Pain in thoracic spine: Secondary | ICD-10-CM | POA: Diagnosis not present

## 2019-02-22 ENCOUNTER — Telehealth: Payer: Self-pay | Admitting: Internal Medicine

## 2019-02-22 NOTE — Telephone Encounter (Signed)
She said she needs to schedule a colonoscopy but has difficulty with standard preps (tolerability of drinking large amounts of liquids).  I told her we could use Clen-Piq when she comes for her colonoscopy and that she can discuss with pre-visit RN

## 2019-02-27 DIAGNOSIS — M8589 Other specified disorders of bone density and structure, multiple sites: Secondary | ICD-10-CM | POA: Diagnosis not present

## 2019-02-27 DIAGNOSIS — R7301 Impaired fasting glucose: Secondary | ICD-10-CM | POA: Diagnosis not present

## 2019-02-27 DIAGNOSIS — E890 Postprocedural hypothyroidism: Secondary | ICD-10-CM | POA: Diagnosis not present

## 2019-02-27 DIAGNOSIS — Z139 Encounter for screening, unspecified: Secondary | ICD-10-CM | POA: Diagnosis not present

## 2019-02-27 DIAGNOSIS — E559 Vitamin D deficiency, unspecified: Secondary | ICD-10-CM | POA: Diagnosis not present

## 2019-02-27 DIAGNOSIS — E7800 Pure hypercholesterolemia, unspecified: Secondary | ICD-10-CM | POA: Diagnosis not present

## 2019-03-20 DIAGNOSIS — E278 Other specified disorders of adrenal gland: Secondary | ICD-10-CM | POA: Diagnosis not present

## 2019-03-20 DIAGNOSIS — M7121 Synovial cyst of popliteal space [Baker], right knee: Secondary | ICD-10-CM | POA: Diagnosis not present

## 2019-03-20 DIAGNOSIS — E785 Hyperlipidemia, unspecified: Secondary | ICD-10-CM | POA: Diagnosis not present

## 2019-03-20 DIAGNOSIS — Z1389 Encounter for screening for other disorder: Secondary | ICD-10-CM | POA: Diagnosis not present

## 2019-03-20 DIAGNOSIS — M353 Polymyalgia rheumatica: Secondary | ICD-10-CM | POA: Diagnosis not present

## 2019-03-20 DIAGNOSIS — Z6831 Body mass index (BMI) 31.0-31.9, adult: Secondary | ICD-10-CM | POA: Diagnosis not present

## 2019-03-20 DIAGNOSIS — M1991 Primary osteoarthritis, unspecified site: Secondary | ICD-10-CM | POA: Diagnosis not present

## 2019-07-12 DIAGNOSIS — M25561 Pain in right knee: Secondary | ICD-10-CM | POA: Diagnosis not present

## 2019-08-08 DIAGNOSIS — H5200 Hypermetropia, unspecified eye: Secondary | ICD-10-CM | POA: Diagnosis not present

## 2019-09-06 DIAGNOSIS — M25561 Pain in right knee: Secondary | ICD-10-CM | POA: Diagnosis not present

## 2019-09-24 DIAGNOSIS — Z23 Encounter for immunization: Secondary | ICD-10-CM | POA: Diagnosis not present

## 2019-09-24 DIAGNOSIS — R69 Illness, unspecified: Secondary | ICD-10-CM | POA: Diagnosis not present

## 2019-09-24 DIAGNOSIS — E119 Type 2 diabetes mellitus without complications: Secondary | ICD-10-CM | POA: Diagnosis not present

## 2019-09-24 DIAGNOSIS — J9602 Acute respiratory failure with hypercapnia: Secondary | ICD-10-CM | POA: Diagnosis not present

## 2019-10-29 DIAGNOSIS — R300 Dysuria: Secondary | ICD-10-CM | POA: Diagnosis not present

## 2019-11-05 DIAGNOSIS — R300 Dysuria: Secondary | ICD-10-CM | POA: Diagnosis not present

## 2019-11-05 DIAGNOSIS — B373 Candidiasis of vulva and vagina: Secondary | ICD-10-CM | POA: Diagnosis not present

## 2019-11-05 DIAGNOSIS — R102 Pelvic and perineal pain: Secondary | ICD-10-CM | POA: Diagnosis not present

## 2019-11-26 DIAGNOSIS — Z1389 Encounter for screening for other disorder: Secondary | ICD-10-CM | POA: Diagnosis not present

## 2019-11-26 DIAGNOSIS — Z0000 Encounter for general adult medical examination without abnormal findings: Secondary | ICD-10-CM | POA: Diagnosis not present

## 2019-11-26 DIAGNOSIS — N393 Stress incontinence (female) (male): Secondary | ICD-10-CM | POA: Diagnosis not present

## 2019-11-26 DIAGNOSIS — Z6831 Body mass index (BMI) 31.0-31.9, adult: Secondary | ICD-10-CM | POA: Diagnosis not present

## 2019-11-26 DIAGNOSIS — M1991 Primary osteoarthritis, unspecified site: Secondary | ICD-10-CM | POA: Diagnosis not present

## 2019-11-26 DIAGNOSIS — E559 Vitamin D deficiency, unspecified: Secondary | ICD-10-CM | POA: Diagnosis not present

## 2019-11-26 DIAGNOSIS — E6609 Other obesity due to excess calories: Secondary | ICD-10-CM | POA: Diagnosis not present

## 2019-11-26 DIAGNOSIS — K219 Gastro-esophageal reflux disease without esophagitis: Secondary | ICD-10-CM | POA: Diagnosis not present

## 2019-11-26 DIAGNOSIS — E538 Deficiency of other specified B group vitamins: Secondary | ICD-10-CM | POA: Diagnosis not present

## 2020-01-01 DIAGNOSIS — R69 Illness, unspecified: Secondary | ICD-10-CM | POA: Diagnosis not present

## 2020-01-18 ENCOUNTER — Encounter: Payer: Self-pay | Admitting: Internal Medicine

## 2020-02-05 ENCOUNTER — Ambulatory Visit (AMBULATORY_SURGERY_CENTER): Payer: Medicare HMO | Admitting: *Deleted

## 2020-02-05 ENCOUNTER — Encounter: Payer: Self-pay | Admitting: *Deleted

## 2020-02-05 ENCOUNTER — Other Ambulatory Visit: Payer: Self-pay

## 2020-02-05 VITALS — Temp 96.9°F | Ht 61.0 in | Wt 171.0 lb

## 2020-02-05 DIAGNOSIS — Z01818 Encounter for other preprocedural examination: Secondary | ICD-10-CM

## 2020-02-05 DIAGNOSIS — Z1211 Encounter for screening for malignant neoplasm of colon: Secondary | ICD-10-CM

## 2020-02-05 MED ORDER — CLENPIQ 10-3.5-12 MG-GM -GM/160ML PO SOLN: 1.0000 | ORAL | 0 refills | Status: DC

## 2020-02-05 NOTE — Progress Notes (Signed)
Patient is here in-person for PV. Patient denies any allergies to eggs or soy. Patient denies any problems with anesthesia/sedation. Patient denies any oxygen use at home. Patient denies taking any diet/weight loss medications or blood thinners. Patient is not being treated for MRSA or C-diff. EMMI education assisgned to the patient for the procedure, this was explained and instructions given to patient. COVID-19 screening test is on 2/25, the pt is aware. Pt is aware that care partner will wait in the car during procedure; if they feel like they will be too hot or cold to wait in the car; they may wait in the 4 th floor lobby. Patient is aware to bring only one care partner. We want them to wear a mask (we do not have any that we can provide them), practice social distancing, and we will check their temperatures when they get here.  I did remind the patient that their care partner needs to stay in the parking lot the entire time and have a cell phone available, we will call them when the pt is ready for discharge. Patient will wear mask into building.    Clenpiq sample given to pt. Pt denies constipation.

## 2020-02-05 NOTE — Progress Notes (Signed)
Ana Mayer, MD  Levonne Spiller, RN  Sure       Previous Messages   ----- Message -----  From: Levonne Spiller, RN  Sent: 02/05/2020 10:32 AM EST  To: Ana Mayer, MD   Dr.Gessner,   FYI:   This patient had her PV today for colonoscopy on 02/19/2020. She told me she is in a "cologuard study". She explained she will pick up a cologuard kit today and complete it before the colonoscopy. The study wants her to have the colonoscopy no matter the results of the cologuard. Ok to proceed?  Thanks, [employer]

## 2020-02-08 ENCOUNTER — Encounter: Payer: Self-pay | Admitting: Internal Medicine

## 2020-02-14 ENCOUNTER — Other Ambulatory Visit (HOSPITAL_COMMUNITY)
Admission: RE | Admit: 2020-02-14 | Discharge: 2020-02-14 | Disposition: A | Discharge status: Home / Self Care | Payer: Medicare HMO | Source: Ambulatory Visit | Attending: Internal Medicine | Admitting: Internal Medicine

## 2020-02-14 ENCOUNTER — Other Ambulatory Visit: Payer: Self-pay

## 2020-02-14 DIAGNOSIS — Z01812 Encounter for preprocedural laboratory examination: Secondary | ICD-10-CM | POA: Diagnosis not present

## 2020-02-14 DIAGNOSIS — Z20822 Contact with and (suspected) exposure to covid-19: Secondary | ICD-10-CM | POA: Diagnosis not present

## 2020-02-14 LAB — SARS CORONAVIRUS 2 (TAT 6-24 HRS): SARS Coronavirus 2: NEGATIVE

## 2020-02-18 DIAGNOSIS — Z86010 Personal history of colonic polyps: Secondary | ICD-10-CM

## 2020-02-18 DIAGNOSIS — Z860101 Personal history of adenomatous and serrated colon polyps: Secondary | ICD-10-CM

## 2020-02-18 HISTORY — DX: Personal history of adenomatous and serrated colon polyps: Z86.0101

## 2020-02-18 HISTORY — DX: Personal history of colonic polyps: Z86.010

## 2020-02-19 ENCOUNTER — Other Ambulatory Visit: Payer: Self-pay

## 2020-02-19 ENCOUNTER — Encounter: Payer: Self-pay | Admitting: Internal Medicine

## 2020-02-19 ENCOUNTER — Ambulatory Visit (AMBULATORY_SURGERY_CENTER): Payer: Medicare HMO | Admitting: Internal Medicine

## 2020-02-19 VITALS — BP 115/67 | HR 73 | Temp 97.5°F | Resp 17 | Ht 61.0 in | Wt 171.0 lb

## 2020-02-19 DIAGNOSIS — Z1211 Encounter for screening for malignant neoplasm of colon: Secondary | ICD-10-CM

## 2020-02-19 DIAGNOSIS — D122 Benign neoplasm of ascending colon: Secondary | ICD-10-CM

## 2020-02-19 DIAGNOSIS — D124 Benign neoplasm of descending colon: Secondary | ICD-10-CM

## 2020-02-19 DIAGNOSIS — K621 Rectal polyp: Secondary | ICD-10-CM | POA: Diagnosis not present

## 2020-02-19 DIAGNOSIS — D128 Benign neoplasm of rectum: Secondary | ICD-10-CM

## 2020-02-19 MED ORDER — SODIUM CHLORIDE 0.9 % IV SOLN: 500.0000 mL | Freq: Once | INTRAVENOUS | Status: DC

## 2020-02-19 NOTE — Progress Notes (Signed)
Called to room to assist during endoscopic procedure.  Patient ID and intended procedure confirmed with present staff. Received instructions for my participation in the procedure from the performing physician.  

## 2020-02-19 NOTE — Progress Notes (Signed)
A and O x3. Report to RN. Tolerated MAC anesthesia well.

## 2020-02-19 NOTE — Op Note (Signed)
Ana Gutierrez Patient Name: Ana Gutierrez Procedure Date: 02/19/2020 7:18 AM MRN: [id number] Endoscopist: Gatha Mayer , MD Age: 74 Referring MD:  Date of Birth: [birth year] Gender: Female Account #: [id number] Procedure:                Colonoscopy Indications:              Screening for colorectal malignant neoplasm Medicines:                Propofol per Anesthesia, Monitored Anesthesia Care Procedure:                Pre-Anesthesia Assessment:                           - Prior to the procedure, a History and Physical                            was performed, and patient medications and                            allergies were reviewed. The patient's tolerance of                            previous anesthesia was also reviewed. The risks                            and benefits of the procedure and the sedation                            options and risks were discussed with the patient.                            All questions were answered, and informed consent                            was obtained. Prior Anticoagulants: The patient has                            taken no previous anticoagulant or antiplatelet                            agents. ASA Grade Assessment: II - A patient with                            mild systemic disease. After reviewing the risks                            and benefits, the patient was deemed in                            satisfactory condition to undergo the procedure.                           After obtaining informed consent, the colonoscope  was passed under direct vision. Throughout the                            procedure, the patient's blood pressure, pulse, and                            oxygen saturations were monitored continuously. The                            Colonoscope was introduced through the anus and                            advanced to the the cecum, identified by   appendiceal orifice and ileocecal valve. The                            colonoscopy was performed without difficulty. The                            patient tolerated the procedure well. The quality                            of the bowel preparation was excellent. The bowel                            preparation used was Miralax via split dose                            instruction. The ileocecal valve, appendiceal                            orifice, and rectum were photographed. Scope In: 8:11:50 AM Scope Out: 8:28:35 AM Scope Withdrawal Time: 0 hours 13 minutes 16 seconds  Total Procedure Duration: 0 hours 16 minutes 45 seconds  Findings:                 The perianal and digital rectal examinations were                            normal.                           Three sessile polyps were found in the rectum,                            descending colon and ascending colon. The polyps                            were diminutive in size. These polyps were removed                            with a cold snare. Resection and retrieval were                            complete. Verification of patient identification  for the specimen was done. Estimated blood loss was                            minimal.                           Multiple diverticula were found in the sigmoid                            colon.                           The exam was otherwise without abnormality on                            direct and retroflexion views. Complications:            No immediate complications. Estimated Blood Loss:     Estimated blood loss was minimal. Impression:               - Three diminutive polyps in the rectum, in the                            descending colon and in the ascending colon,                            removed with a cold snare. Resected and retrieved.                           - Diverticulosis in the sigmoid colon.                           - The  examination was otherwise normal on direct                            and retroflexion views. Recommendation:           - Patient has a contact number available for                            emergencies. The signs and symptoms of potential                            delayed complications were discussed with the                            patient. Return to normal activities tomorrow.                            Written discharge instructions were provided to the                            patient.                           - Resume previous diet.                           -  Continue present medications.                           - Repeat colonoscopy is recommended. The                            colonoscopy date will be determined after pathology                            results from today's exam become available for                            review.                           - No recommendation at this time regarding repeat                            colonoscopy due to age. Gatha Mayer, MD 02/19/2020 8:41:46 AM This report has been signed electronically.

## 2020-02-19 NOTE — Progress Notes (Signed)
Pt's states no medical or surgical changes since previsit or office visit.  JB - temp CW - vitals. 

## 2020-02-19 NOTE — Patient Instructions (Addendum)
I found and removed 3 tiny polyps. All look benign.  You also have a condition called diverticulosis - common and not usually a problem. Please read the handout provided.  I will let you know pathology results and when/if to have another routine colonoscopy by mail and/or My Chart.  I appreciate the opportunity to care for you. Gatha Mayer, MD, [facility]  Please read handouts provided. Continue present medications. Await pathology results.     YOU HAD AN ENDOSCOPIC PROCEDURE TODAY AT [facility]:   Refer to the procedure report that was given to you for any specific questions about what was found during the examination.  If the procedure report does not answer your questions, please call your gastroenterologist to clarify.  If you requested that your care partner not be given the details of your procedure findings, then the procedure report has been included in a sealed envelope for you to review at your convenience later.  YOU SHOULD EXPECT: Some feelings of bloating in the abdomen. Passage of more gas than usual.  Walking can help get rid of the air that was put into your GI tract during the procedure and reduce the bloating. If you had a lower endoscopy (such as a colonoscopy or flexible sigmoidoscopy) you may notice spotting of blood in your stool or on the toilet paper. If you underwent a bowel prep for your procedure, you may not have a normal bowel movement for a few days.  Please Note:  You might notice some irritation and congestion in your nose or some drainage.  This is from the oxygen used during your procedure.  There is no need for concern and it should clear up in a day or so.  SYMPTOMS TO REPORT IMMEDIATELY:   Following lower endoscopy (colonoscopy or flexible sigmoidoscopy):  Excessive amounts of blood in the stool  Significant tenderness or worsening of abdominal pains  Swelling of the abdomen that is new, acute  Fever of 100F or higher   For urgent  or emergent issues, a gastroenterologist can be reached at any hour by calling [phone].   DIET:  We do recommend a small meal at first, but then you may proceed to your regular diet.  Drink plenty of fluids but you should avoid alcoholic beverages for 24 hours.  ACTIVITY:  You should plan to take it easy for the rest of today and you should NOT DRIVE or use heavy machinery until tomorrow (because of the sedation medicines used during the test).    FOLLOW UP: Our staff will call the number listed on your records 48-72 hours following your procedure to check on you and address any questions or concerns that you may have regarding the information given to you following your procedure. If we do not reach you, we will leave a message.  We will attempt to reach you two times.  During this call, we will ask if you have developed any symptoms of COVID 19. If you develop any symptoms (ie: fever, flu-like symptoms, shortness of breath, cough etc.) before then, please call [phone].  If you test positive for Covid 19 in the 2 weeks post procedure, please call and report this information to [place].    If any biopsies were taken you will be contacted by phone or by letter within the next 1-3 weeks.  Please call us at [phone] if you have not heard about the biopsies in 3 weeks.    SIGNATURES/CONFIDENTIALITY: You and/or your care  partner have signed paperwork which will be entered into your electronic medical record.  These signatures attest to the fact that that the information above on your After Visit Summary has been reviewed and is understood.  Full responsibility of the confidentiality of this discharge information lies with you and/or your care-partner.

## 2020-02-20 DIAGNOSIS — E559 Vitamin D deficiency, unspecified: Secondary | ICD-10-CM | POA: Diagnosis not present

## 2020-02-20 DIAGNOSIS — E7800 Pure hypercholesterolemia, unspecified: Secondary | ICD-10-CM | POA: Diagnosis not present

## 2020-02-20 DIAGNOSIS — R7309 Other abnormal glucose: Secondary | ICD-10-CM | POA: Diagnosis not present

## 2020-02-20 DIAGNOSIS — E7849 Other hyperlipidemia: Secondary | ICD-10-CM | POA: Diagnosis not present

## 2020-02-20 DIAGNOSIS — E890 Postprocedural hypothyroidism: Secondary | ICD-10-CM | POA: Diagnosis not present

## 2020-02-20 DIAGNOSIS — R7301 Impaired fasting glucose: Secondary | ICD-10-CM | POA: Diagnosis not present

## 2020-02-21 ENCOUNTER — Telehealth: Payer: Self-pay | Admitting: *Deleted

## 2020-02-21 NOTE — Telephone Encounter (Signed)
  Follow up Call-  Call back number 02/19/2020 07/19/2017  Post procedure Call Back phone  # [phone]  Permission to leave phone message Yes Yes  Some recent data might be hidden     Patient questions:  Do you have a fever, pain , or abdominal swelling? No. Pain Score  0 *  Have you tolerated food without any problems? Yes.    Have you been able to return to your normal activities? Yes.    Do you have any questions about your discharge instructions: Diet   No. Medications  No. Follow up visit  No.  Do you have questions or concerns about your Care? No.  Actions: * If pain score is 4 or above: No action needed, pain <4.  1. Have you developed a fever since your procedure? no  2.   Have you had an respiratory symptoms (SOB or cough) since your procedure? no  3.   Have you tested positive for COVID 19 since your procedure no  4.   Have you had any family members/close contacts diagnosed with the COVID 19 since your procedure?  no   If yes to any of these questions please route to Joylene John, RN and Alphonsa Gin, Therapist, sports.

## 2020-02-27 DIAGNOSIS — R7301 Impaired fasting glucose: Secondary | ICD-10-CM | POA: Diagnosis not present

## 2020-02-27 DIAGNOSIS — E119 Type 2 diabetes mellitus without complications: Secondary | ICD-10-CM | POA: Diagnosis not present

## 2020-02-27 DIAGNOSIS — E7800 Pure hypercholesterolemia, unspecified: Secondary | ICD-10-CM | POA: Diagnosis not present

## 2020-02-27 DIAGNOSIS — E890 Postprocedural hypothyroidism: Secondary | ICD-10-CM | POA: Diagnosis not present

## 2020-02-27 DIAGNOSIS — E559 Vitamin D deficiency, unspecified: Secondary | ICD-10-CM | POA: Diagnosis not present

## 2020-03-03 ENCOUNTER — Encounter: Payer: Self-pay | Admitting: Internal Medicine

## 2020-04-28 DIAGNOSIS — R69 Illness, unspecified: Secondary | ICD-10-CM | POA: Diagnosis not present

## 2020-05-21 DIAGNOSIS — E890 Postprocedural hypothyroidism: Secondary | ICD-10-CM | POA: Diagnosis not present

## 2020-05-21 DIAGNOSIS — E7800 Pure hypercholesterolemia, unspecified: Secondary | ICD-10-CM | POA: Diagnosis not present

## 2020-05-21 DIAGNOSIS — R7301 Impaired fasting glucose: Secondary | ICD-10-CM | POA: Diagnosis not present

## 2020-05-28 DIAGNOSIS — E559 Vitamin D deficiency, unspecified: Secondary | ICD-10-CM | POA: Diagnosis not present

## 2020-05-28 DIAGNOSIS — E7800 Pure hypercholesterolemia, unspecified: Secondary | ICD-10-CM | POA: Diagnosis not present

## 2020-05-28 DIAGNOSIS — R7301 Impaired fasting glucose: Secondary | ICD-10-CM | POA: Diagnosis not present

## 2020-05-28 DIAGNOSIS — E890 Postprocedural hypothyroidism: Secondary | ICD-10-CM | POA: Diagnosis not present

## 2020-05-28 DIAGNOSIS — E119 Type 2 diabetes mellitus without complications: Secondary | ICD-10-CM | POA: Diagnosis not present

## 2020-05-29 DIAGNOSIS — R69 Illness, unspecified: Secondary | ICD-10-CM | POA: Diagnosis not present

## 2020-06-01 DIAGNOSIS — R69 Illness, unspecified: Secondary | ICD-10-CM | POA: Diagnosis not present

## 2020-06-07 IMAGING — US ULTRASOUND RIGHT BREAST LIMITED
1 series · 7 of 7 positions shown · non-contrast
Comparison: December 28, 2018 screening mammogram.

CLINICAL DATA: 72-year-old patient recalled from recent screening
mammogram for evaluation of a possible asymmetry in the right breast
and a possible mass in the left breast.

EXAM:
DIGITAL DIAGNOSTIC BILATERAL MAMMOGRAM WITH CAD AND TOMO
ULTRASOUND BILATERAL BREASTS

[Series 1: ultrasound right breast limited · 0.06mm/px · 7 of 7 slices shown]
[im 1/7]
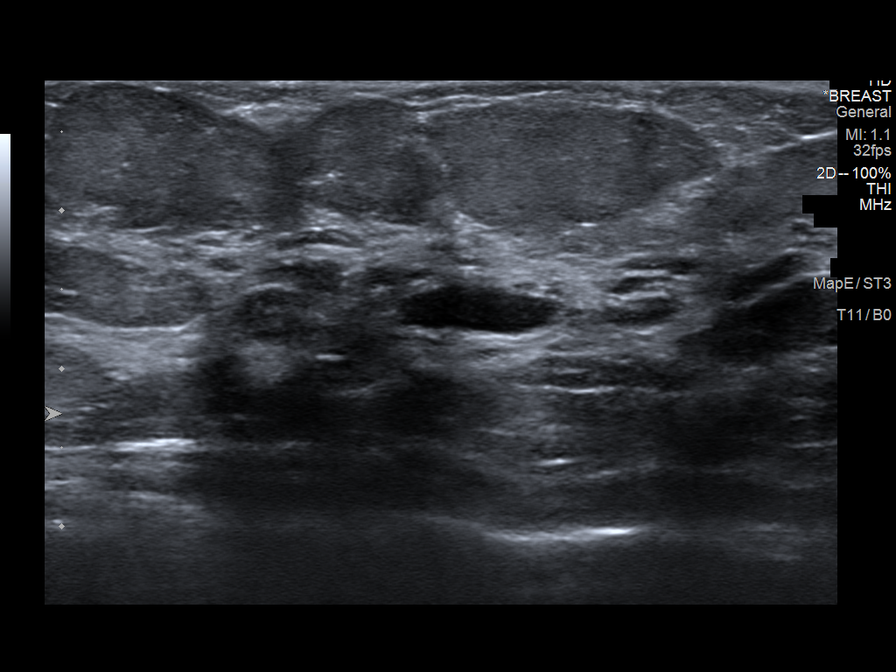
[im 2/7]
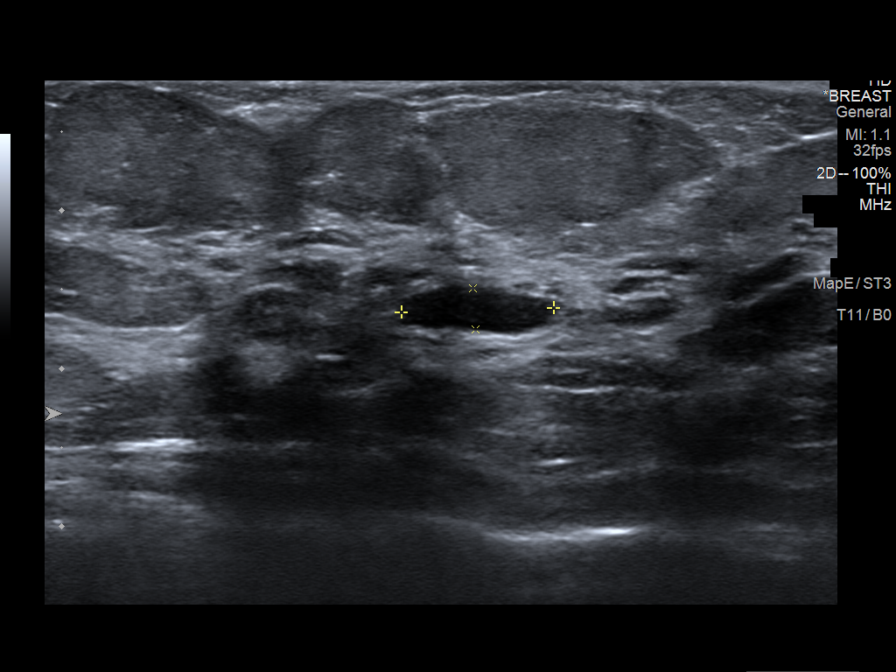
[im 3/7]
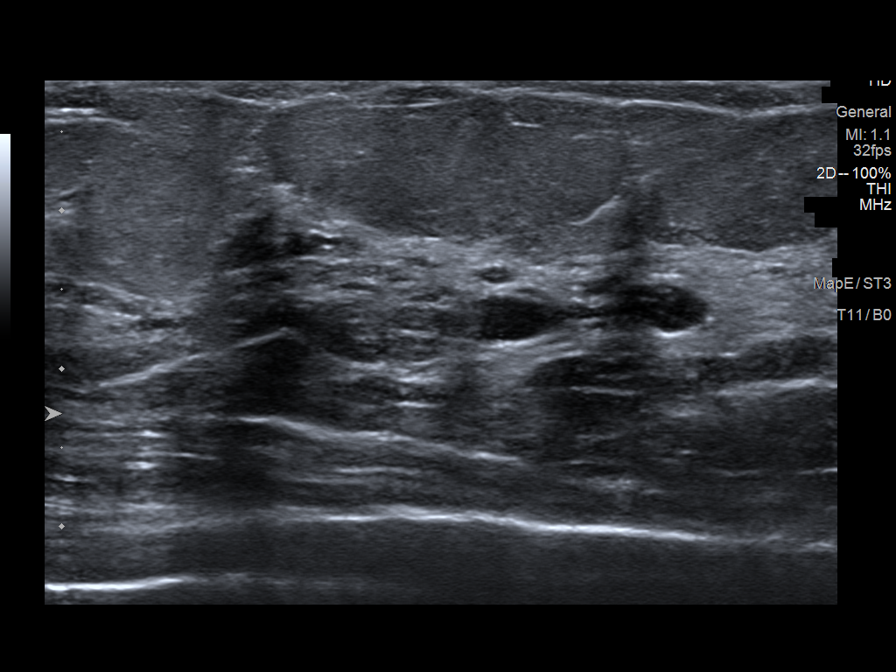
[im 4/7]
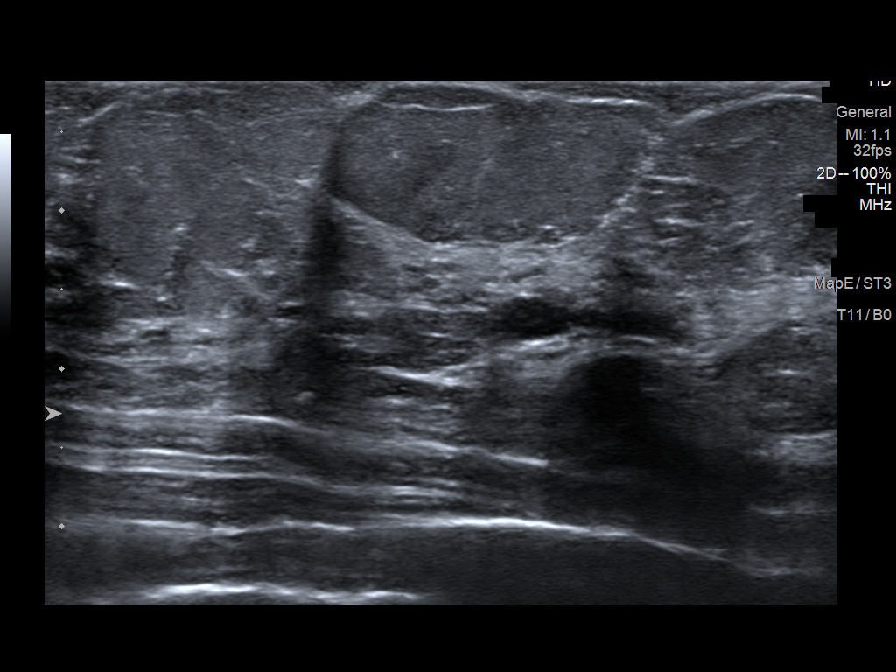
[im 5/7]
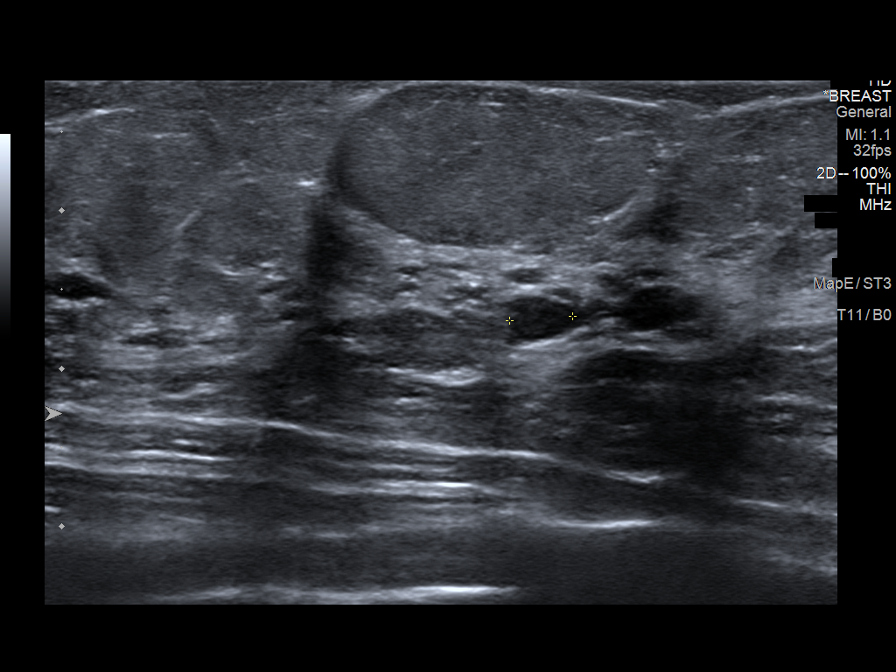
[im 6/7]
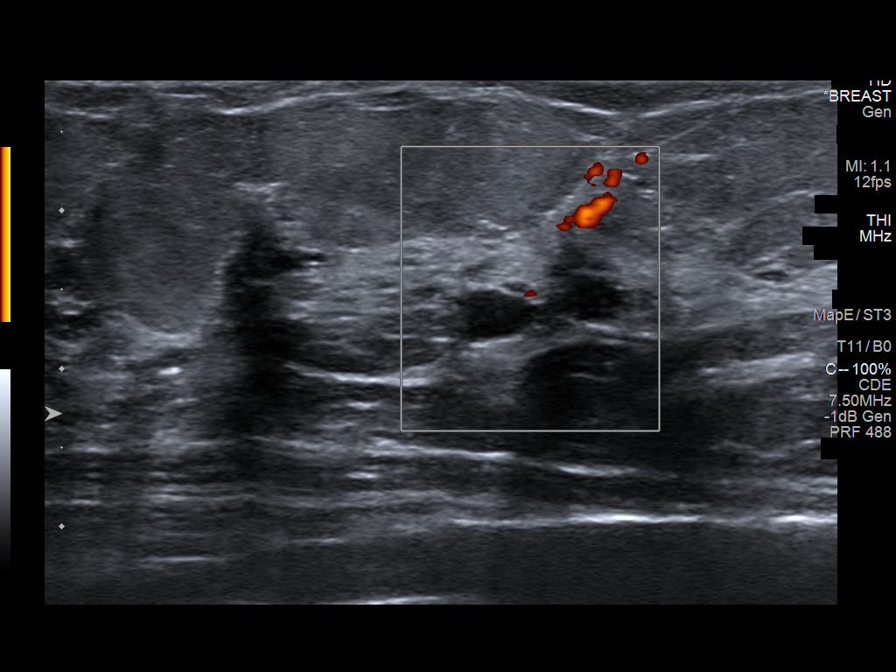
[im 7/7]
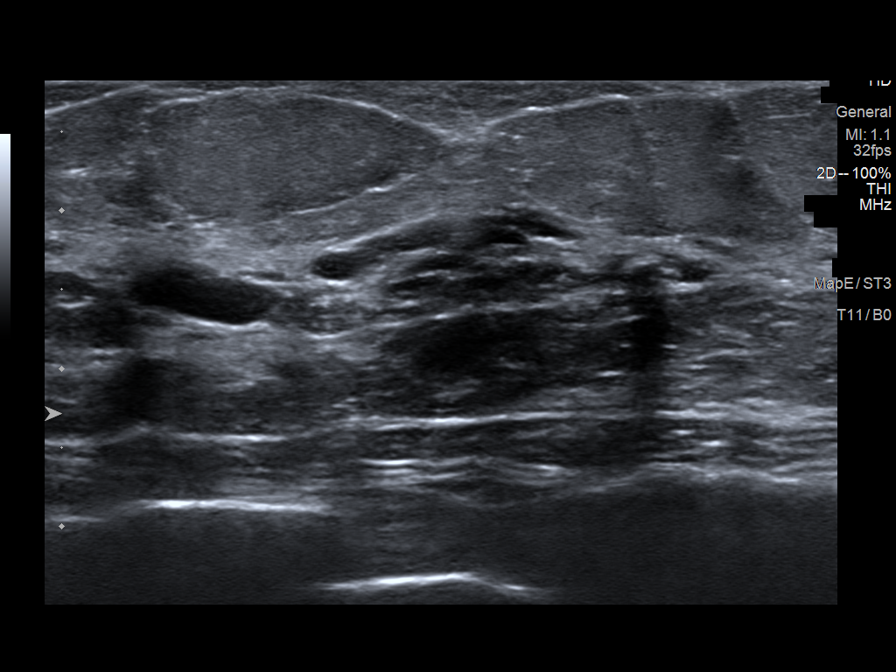

[7 of 7 positions shown; findings below may reference images not displayed]

The most recent
comparison earlier than 7777 is October 04, 2011.

ACR Breast Density Category c: The breast tissue is heterogeneously
dense, which may obscure small masses.
FINDINGS: Spot compression views of the outer right breast with tomography
show heterogeneously dense tissue, without suspicious mass.

Spot compression views of the inferior and slightly medial left
breast show a possible low-density oval mass.

Mammographic images were processed with CAD.

Targeted ultrasound is performed, showing duct ectasia in the 8
o'clock region of the right breast. An oval 1.0 x 0.3 x 0.4 cm
simple cyst is identified at 8 o'clock position 3 cm from the
nipple. No suspicious findings in the right breast.

In the 9 o'clock position left breast 5 cm from the nipple is a
x 0.9 x 0.5 cm simple cyst, accounting for the mass seen on the
mammogram. No suspicious findings on ultrasound.
IMPRESSION: Benign cysts bilaterally. No evidence of malignancy in either
breast.

RECOMMENDATION:
Screening mammogram in one year.(Code:T3-Z-WRM)

I have discussed the findings and recommendations with the patient.
Results were also provided in writing at the conclusion of the
visit. If applicable, a reminder letter will be sent to the patient
regarding the next appointment.

BI-RADS CATEGORY  2: Benign.

## 2020-06-07 IMAGING — MG DIGITAL DIAGNOSTIC BILATERAL MAMMOGRAM WITH TOMO AND CAD
8 series · 8 of 24 positions shown · non-contrast
Comparison: December 28, 2018 screening mammogram.

CLINICAL DATA: 72-year-old patient recalled from recent screening
mammogram for evaluation of a possible asymmetry in the right breast
and a possible mass in the left breast.

EXAM:
DIGITAL DIAGNOSTIC BILATERAL MAMMOGRAM WITH CAD AND TOMO
ULTRASOUND BILATERAL BREASTS

[L CC synth-2D]
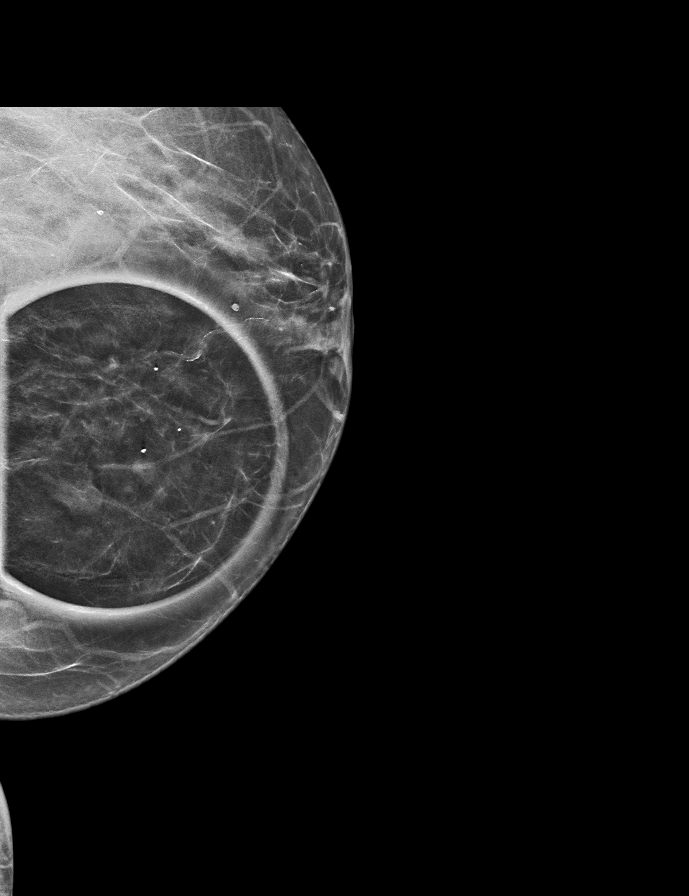

[L MLO synth-2D]
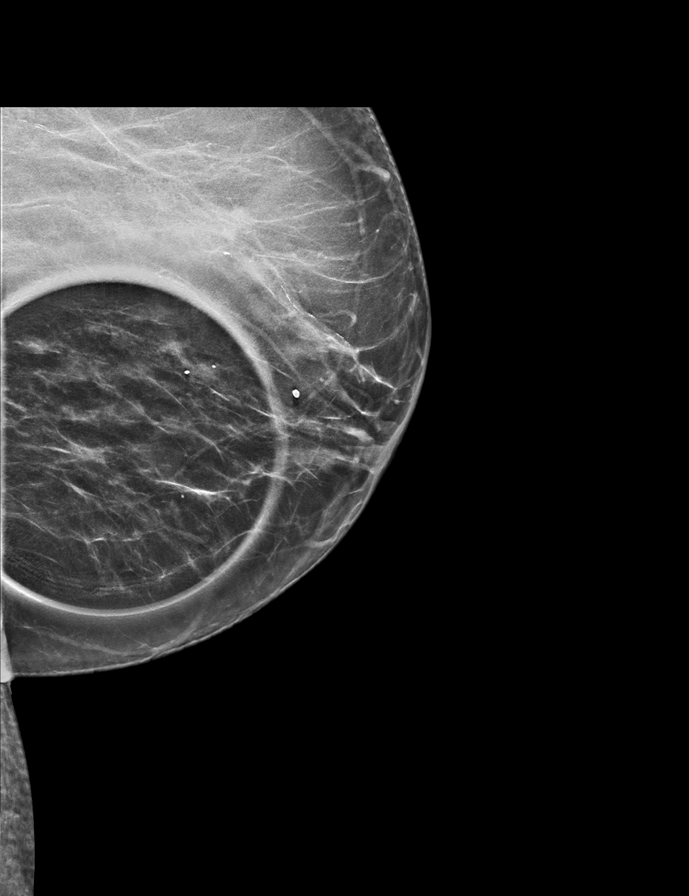

[R CC synth-2D]
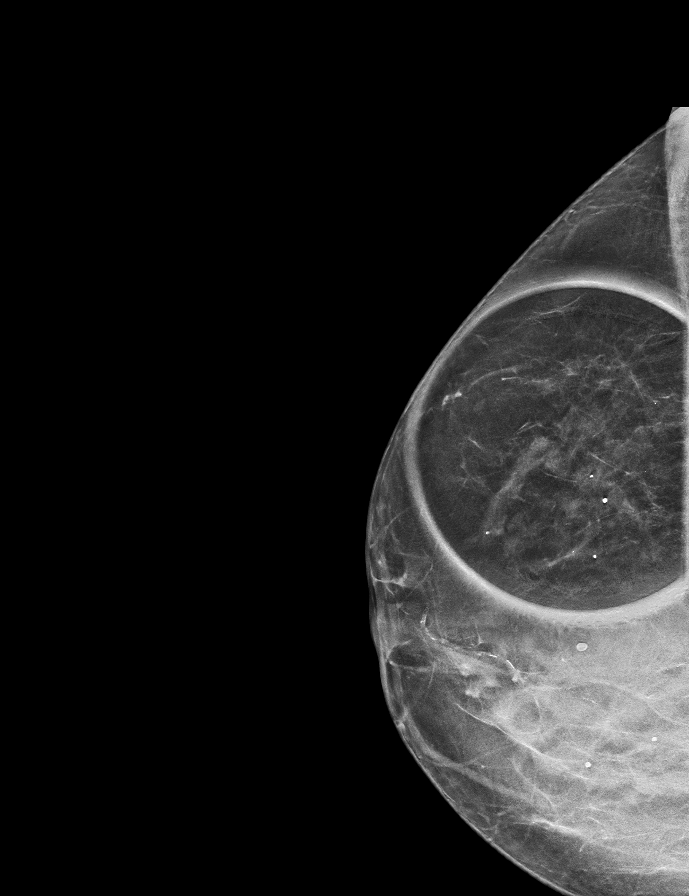

[R MLO synth-2D]
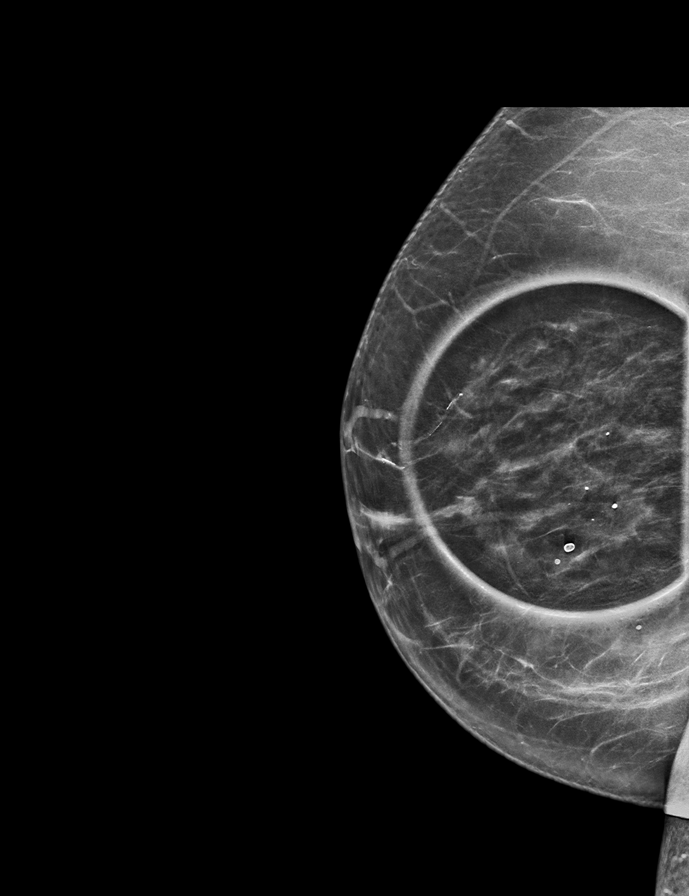

[R CC tomo · tomo slice 24/47.0]
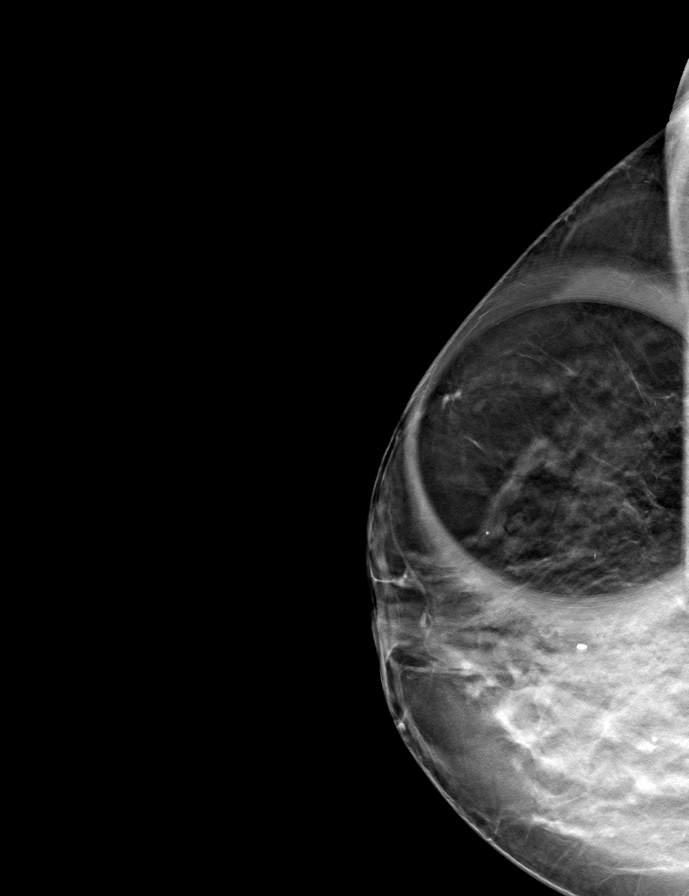

[L MLO tomo · tomo slice 28/55.0]
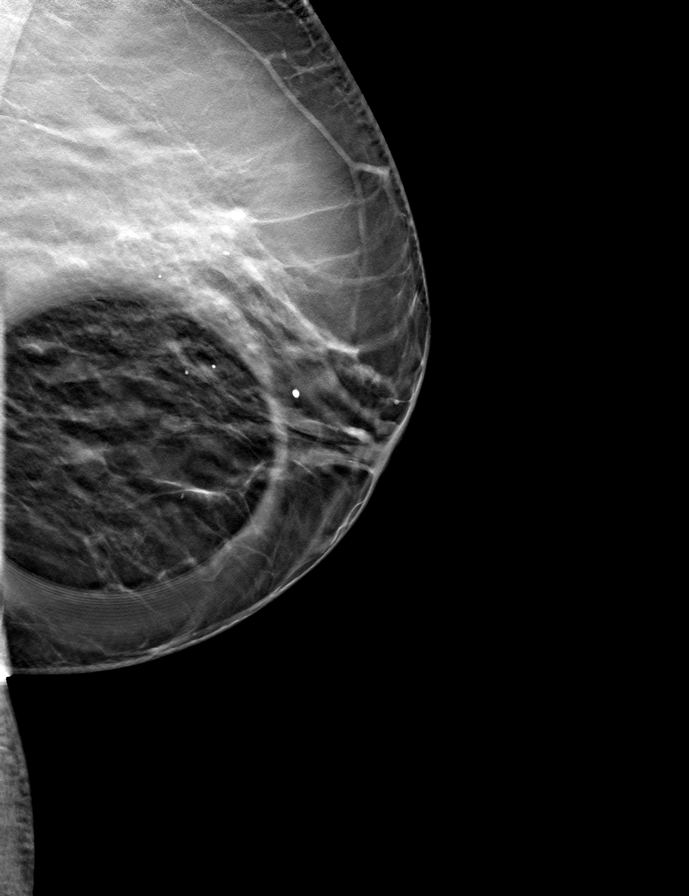

[L CC tomo · tomo slice 26/51.0]
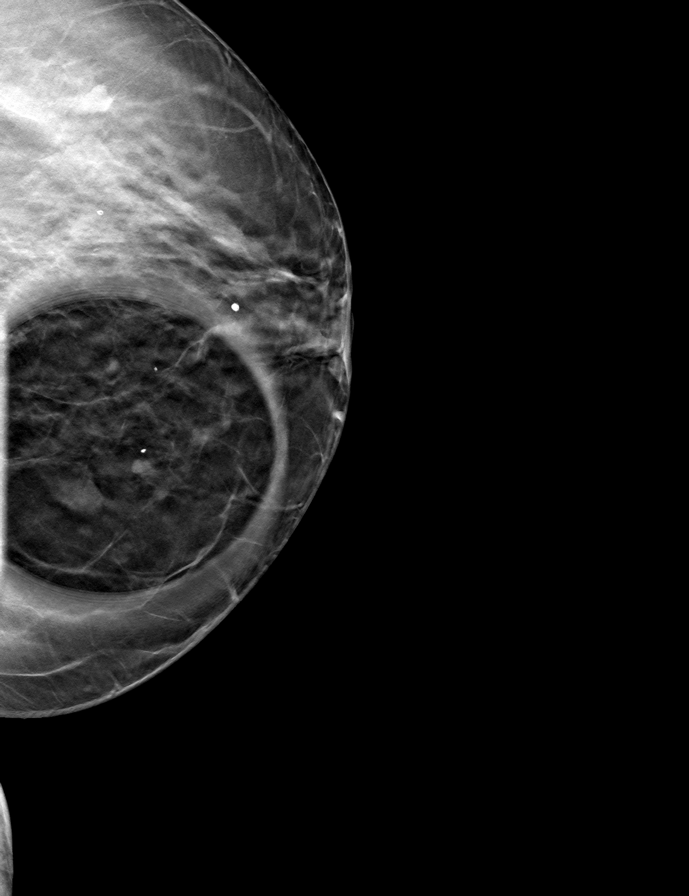

[R MLO tomo · tomo slice 29/56.0]
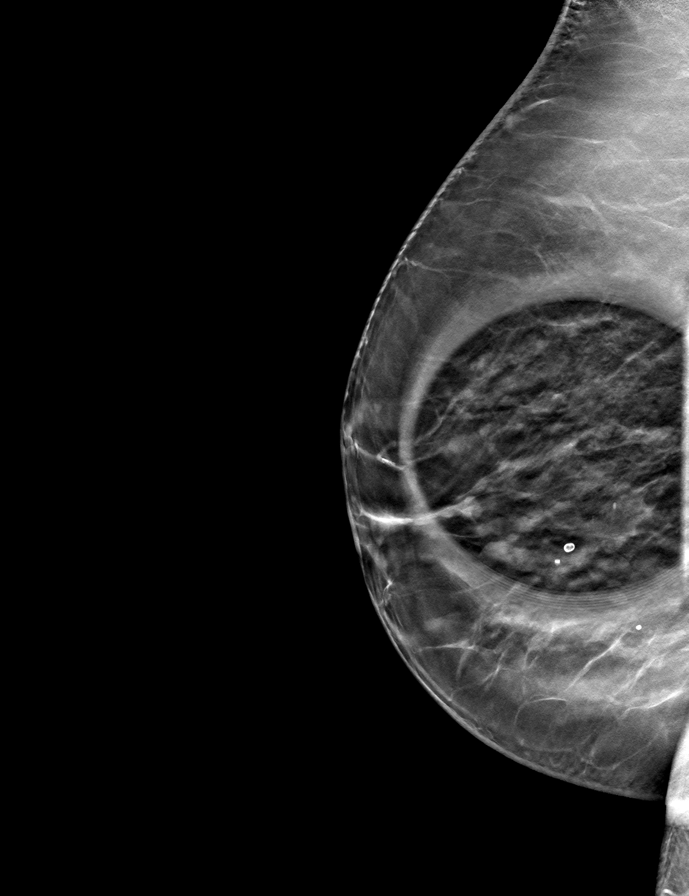

[8 of 24 positions shown; findings below may reference images not displayed]

The most recent
comparison earlier than 7777 is October 04, 2011.

ACR Breast Density Category c: The breast tissue is heterogeneously
dense, which may obscure small masses.
FINDINGS: Spot compression views of the outer right breast with tomography
show heterogeneously dense tissue, without suspicious mass.

Spot compression views of the inferior and slightly medial left
breast show a possible low-density oval mass.

Mammographic images were processed with CAD.

Targeted ultrasound is performed, showing duct ectasia in the 8
o'clock region of the right breast. An oval 1.0 x 0.3 x 0.4 cm
simple cyst is identified at 8 o'clock position 3 cm from the
nipple. No suspicious findings in the right breast.

In the 9 o'clock position left breast 5 cm from the nipple is a
x 0.9 x 0.5 cm simple cyst, accounting for the mass seen on the
mammogram. No suspicious findings on ultrasound.
IMPRESSION: Benign cysts bilaterally. No evidence of malignancy in either
breast.

RECOMMENDATION:
Screening mammogram in one year.(Code:T3-Z-WRM)

I have discussed the findings and recommendations with the patient.
Results were also provided in writing at the conclusion of the
visit. If applicable, a reminder letter will be sent to the patient
regarding the next appointment.

BI-RADS CATEGORY  2: Benign.

## 2020-06-10 DIAGNOSIS — H1013 Acute atopic conjunctivitis, bilateral: Secondary | ICD-10-CM | POA: Diagnosis not present

## 2020-06-10 DIAGNOSIS — H2513 Age-related nuclear cataract, bilateral: Secondary | ICD-10-CM | POA: Diagnosis not present

## 2020-07-09 DIAGNOSIS — E890 Postprocedural hypothyroidism: Secondary | ICD-10-CM | POA: Diagnosis not present

## 2020-07-21 DIAGNOSIS — R69 Illness, unspecified: Secondary | ICD-10-CM | POA: Diagnosis not present

## 2020-08-08 DIAGNOSIS — H5200 Hypermetropia, unspecified eye: Secondary | ICD-10-CM | POA: Diagnosis not present

## 2020-08-08 DIAGNOSIS — H2513 Age-related nuclear cataract, bilateral: Secondary | ICD-10-CM | POA: Diagnosis not present

## 2020-08-08 DIAGNOSIS — E119 Type 2 diabetes mellitus without complications: Secondary | ICD-10-CM | POA: Diagnosis not present

## 2020-08-08 DIAGNOSIS — H1045 Other chronic allergic conjunctivitis: Secondary | ICD-10-CM | POA: Diagnosis not present

## 2020-08-20 DIAGNOSIS — Z0100 Encounter for examination of eyes and vision without abnormal findings: Secondary | ICD-10-CM | POA: Diagnosis not present

## 2020-10-07 DIAGNOSIS — Z1331 Encounter for screening for depression: Secondary | ICD-10-CM | POA: Diagnosis not present

## 2020-10-07 DIAGNOSIS — M1388 Other specified arthritis, other site: Secondary | ICD-10-CM | POA: Diagnosis not present

## 2020-10-07 DIAGNOSIS — Z6829 Body mass index (BMI) 29.0-29.9, adult: Secondary | ICD-10-CM | POA: Diagnosis not present

## 2020-10-07 DIAGNOSIS — K219 Gastro-esophageal reflux disease without esophagitis: Secondary | ICD-10-CM | POA: Diagnosis not present

## 2020-10-07 DIAGNOSIS — M353 Polymyalgia rheumatica: Secondary | ICD-10-CM | POA: Diagnosis not present

## 2020-10-07 DIAGNOSIS — Z23 Encounter for immunization: Secondary | ICD-10-CM | POA: Diagnosis not present

## 2020-10-07 DIAGNOSIS — E119 Type 2 diabetes mellitus without complications: Secondary | ICD-10-CM | POA: Diagnosis not present

## 2020-10-07 DIAGNOSIS — R5383 Other fatigue: Secondary | ICD-10-CM | POA: Diagnosis not present

## 2020-10-08 DIAGNOSIS — Z23 Encounter for immunization: Secondary | ICD-10-CM | POA: Diagnosis not present

## 2020-10-08 DIAGNOSIS — Z1389 Encounter for screening for other disorder: Secondary | ICD-10-CM | POA: Diagnosis not present

## 2020-10-08 DIAGNOSIS — E538 Deficiency of other specified B group vitamins: Secondary | ICD-10-CM | POA: Diagnosis not present

## 2020-10-08 DIAGNOSIS — R5383 Other fatigue: Secondary | ICD-10-CM | POA: Diagnosis not present

## 2020-11-19 DIAGNOSIS — E559 Vitamin D deficiency, unspecified: Secondary | ICD-10-CM | POA: Diagnosis not present

## 2020-11-19 DIAGNOSIS — E890 Postprocedural hypothyroidism: Secondary | ICD-10-CM | POA: Diagnosis not present

## 2020-11-19 DIAGNOSIS — R7301 Impaired fasting glucose: Secondary | ICD-10-CM | POA: Diagnosis not present

## 2020-11-19 DIAGNOSIS — E7800 Pure hypercholesterolemia, unspecified: Secondary | ICD-10-CM | POA: Diagnosis not present

## 2020-11-20 DIAGNOSIS — R69 Illness, unspecified: Secondary | ICD-10-CM | POA: Diagnosis not present

## 2020-11-26 DIAGNOSIS — E7800 Pure hypercholesterolemia, unspecified: Secondary | ICD-10-CM | POA: Diagnosis not present

## 2020-11-26 DIAGNOSIS — E890 Postprocedural hypothyroidism: Secondary | ICD-10-CM | POA: Diagnosis not present

## 2020-11-26 DIAGNOSIS — R7301 Impaired fasting glucose: Secondary | ICD-10-CM | POA: Diagnosis not present

## 2020-11-26 DIAGNOSIS — E559 Vitamin D deficiency, unspecified: Secondary | ICD-10-CM | POA: Diagnosis not present

## 2020-11-26 DIAGNOSIS — E119 Type 2 diabetes mellitus without complications: Secondary | ICD-10-CM | POA: Diagnosis not present

## 2020-11-28 DIAGNOSIS — R69 Illness, unspecified: Secondary | ICD-10-CM | POA: Diagnosis not present

## 2020-12-19 ENCOUNTER — Other Ambulatory Visit: Payer: Self-pay

## 2020-12-19 ENCOUNTER — Encounter: Payer: Self-pay | Admitting: Emergency Medicine

## 2020-12-19 ENCOUNTER — Ambulatory Visit
Admission: EM | Admit: 2020-12-19 | Discharge: 2020-12-19 | Disposition: A | Discharge status: Home / Self Care | Payer: Medicare HMO | Attending: Family Medicine | Admitting: Family Medicine

## 2020-12-19 DIAGNOSIS — B349 Viral infection, unspecified: Secondary | ICD-10-CM | POA: Diagnosis not present

## 2020-12-19 DIAGNOSIS — Z7689 Persons encountering health services in other specified circumstances: Secondary | ICD-10-CM | POA: Diagnosis not present

## 2020-12-19 NOTE — ED Provider Notes (Signed)
[facility]    CSN: [id number] Arrival date & time: 12/19/20  1317      History   Chief Complaint Chief Complaint  Patient presents with  . Generalized Body Aches    HPI Oscar H Langhorne is a 74 y.o. female.   Patient is a 74 year old female presents today with generalized body aches, headaches, congestion.  Was exposed to Covid over Christmas.  Symptoms have been for the past 4 to 5 days.  She is taking Tylenol, ibuprofen.  Feels better with taking this medication.  No fever, chest pain or shortness of breath     Past Medical History:  Diagnosis Date  . Anxiety   . Elevated lipase 05/20/2015  . GERD (gastroesophageal reflux disease)   . Hx of adenomatous colonic polyps 02/2020   2 diminutive - no recall  . Hyperlipidemia   . Hypothyroidism   . Pancreas divisum 05/20/2015    Patient Active Problem List   Diagnosis Date Noted  . Pancreas divisum 05/20/2015  . Elevated lipase 05/20/2015  . Unspecified hypothyroidism 03/26/2013  . SORE THROAT 02/25/2010  . GERD 02/25/2010    Past Surgical History:  Procedure Laterality Date  . ADRENALECTOMY Right 05/22/14  . BRAVO [place] STUDY  2011   negative  . COLONOSCOPY  05/29/2007   Dr.Mann=hyperplastic polyps - repeat 2021 2 dimin adenomas  . ESOPHAGOGASTRODUODENOSCOPY    . TUBAL LIGATION    . UPPER GASTROINTESTINAL ENDOSCOPY      OB History   No obstetric history on file.      Home Medications    Prior to Admission medications   Medication Sig Start Date End Date Taking? Authorizing Provider  Ascorbic Acid (VITAMIN C PO) Take by mouth.    [provider]  b complex vitamins tablet Take 1 tablet by mouth daily.    [provider]  Cholecalciferol (VITAMIN D3 PO) Take by mouth.    [provider]  Coenzyme Q10 (CO Q 10) 100 MG CAPS Take 100 mg by mouth daily.     [provider]  COLLAGEN PO Take by mouth.    [provider]  ELDERBERRY PO Take by mouth.     [provider]  fish oil-omega-3 fatty acids 1000 MG capsule Take 2 g by mouth daily.     [provider]  levothyroxine (EUTHYROX) 112 MCG tablet Euthyrox 112 mcg tablet    [provider]  Multiple Vitamins-Minerals (ZINC PO) Take by mouth.    [provider]  pantoprazole (PROTONIX) 40 MG tablet Take 1 tablet (40 mg total) by mouth 2 (two) times daily before a meal. Before breakfast + supper 07/19/17   Gatha Mayer, MD  Probiotic Product (ACIDOPHILUS/BIFIDUS PO) Take by mouth.    [provider]  UNABLE TO FIND Med Name: Glucocil po    [provider]    Family History Family History  Problem Relation Age of Onset  . Diabetes Mother   . Heart disease Mother   . Diabetes Maternal Aunt   . Colon cancer Maternal Grandfather   . Stomach cancer Neg Hx   . Esophageal cancer Neg Hx   . Rectal cancer Neg Hx   . Colon polyps Neg Hx     Social History Social History   Tobacco Use  . Smoking status: Light Tobacco Smoker    Packs/day: 0.25    Types: Cigarettes    Last attempt to quit: 12/21/2007    Years since quitting: 13.0  .  Smokeless tobacco: Never Used  . Tobacco comment: Quit in 2009-started back smoking   Vaping Use  . Vaping Use: Never used  Substance Use Topics  . Alcohol use: Not Currently    Alcohol/week: 0.0 standard drinks  . Drug use: No     Allergies   Crestor [rosuvastatin], Lipitor [atorvastatin], Metoprolol tartrate, and Penicillins   Review of Systems Review of Systems   Physical Exam Triage Vital Signs ED Triage Vitals [12/19/20 1404]  Enc Vitals Group     BP 140/78     Pulse Rate 94     Resp 18     Temp 98.5 F (36.9 C)     Temp Source Oral     SpO2 96 %     Weight      Height      Head Circumference      Peak Flow      Pain Score 7     Pain Loc      Pain Edu?      Excl. in [place]?    No data found.  Updated Vital Signs BP 140/78 (BP Location: Right Arm)   Pulse 94   Temp 98.5 F  (36.9 C) (Oral)   Resp 18   LMP  (LMP Unknown)   SpO2 96%   Visual Acuity Right Eye Distance:   Left Eye Distance:   Bilateral Distance:    Right Eye Near:   Left Eye Near:    Bilateral Near:     Physical Exam Vitals and nursing note reviewed.  Constitutional:      General: She is not in acute distress.    Appearance: Normal appearance. She is not ill-appearing, toxic-appearing or diaphoretic.  HENT:     Head: Normocephalic.     Nose: Nose normal.  Eyes:     Conjunctiva/sclera: Conjunctivae normal.  Pulmonary:     Effort: Pulmonary effort is normal.  Musculoskeletal:        General: Normal range of motion.     Cervical back: Normal range of motion.  Skin:    General: Skin is warm and dry.     Findings: No rash.  Neurological:     Mental Status: She is alert.  Psychiatric:        Mood and Affect: Mood normal.      UC Treatments / Results  Labs (all labs ordered are listed, but only abnormal results are displayed) Labs Reviewed  NOVEL CORONAVIRUS, NAA    EKG   Radiology No results found.  Procedures Procedures (including critical care time)  Medications Ordered in UC Medications - No data to display  Initial Impression / Assessment and Plan / UC Course  I have reviewed the triage vital signs and the nursing notes.  Pertinent labs & imaging results that were available during my care of the patient were reviewed by me and considered in my medical decision making (see chart for details).     Viral illness Concern for Covid based on symptoms and exposure.  Covid and flu swab pending.  Recommend over-the-counter medicine as needed. Follow up as needed for continued or worsening symptoms  Final Clinical Impressions(s) / UC Diagnoses   Final diagnoses:  Viral illness     Discharge Instructions     Concern for Covid based on your symptoms.  Your Covid and flu swab are pending.  Keep taking the over-the-counter medicines as needed Follow up as  needed for continued or worsening symptoms     ED  Prescriptions    None     PDMP not reviewed this encounter.   Janace Aris, NP 12/19/20 1452

## 2020-12-19 NOTE — ED Triage Notes (Signed)
Pt said was exposed during xmas with covid, now pt has headache, body aches, sore all over. No fevers,

## 2020-12-19 NOTE — Discharge Instructions (Addendum)
Concern for Covid based on your symptoms.  Your Covid and flu swab are pending.  Keep taking the over-the-counter medicines as needed Follow up as needed for continued or worsening symptoms

## 2020-12-23 LAB — NOVEL CORONAVIRUS, NAA: SARS-CoV-2, NAA: DETECTED — AB

## 2020-12-30 DIAGNOSIS — E663 Overweight: Secondary | ICD-10-CM | POA: Diagnosis not present

## 2020-12-30 DIAGNOSIS — Z0000 Encounter for general adult medical examination without abnormal findings: Secondary | ICD-10-CM | POA: Diagnosis not present

## 2020-12-30 DIAGNOSIS — K219 Gastro-esophageal reflux disease without esophagitis: Secondary | ICD-10-CM | POA: Diagnosis not present

## 2020-12-30 DIAGNOSIS — E559 Vitamin D deficiency, unspecified: Secondary | ICD-10-CM | POA: Diagnosis not present

## 2020-12-30 DIAGNOSIS — Z0001 Encounter for general adult medical examination with abnormal findings: Secondary | ICD-10-CM | POA: Diagnosis not present

## 2020-12-30 DIAGNOSIS — Z1389 Encounter for screening for other disorder: Secondary | ICD-10-CM | POA: Diagnosis not present

## 2020-12-30 DIAGNOSIS — Z6829 Body mass index (BMI) 29.0-29.9, adult: Secondary | ICD-10-CM | POA: Diagnosis not present

## 2020-12-30 DIAGNOSIS — E538 Deficiency of other specified B group vitamins: Secondary | ICD-10-CM | POA: Diagnosis not present

## 2020-12-30 DIAGNOSIS — E119 Type 2 diabetes mellitus without complications: Secondary | ICD-10-CM | POA: Diagnosis not present

## 2020-12-30 DIAGNOSIS — J329 Chronic sinusitis, unspecified: Secondary | ICD-10-CM | POA: Diagnosis not present

## 2020-12-30 DIAGNOSIS — Z1331 Encounter for screening for depression: Secondary | ICD-10-CM | POA: Diagnosis not present

## 2020-12-30 DIAGNOSIS — E7849 Other hyperlipidemia: Secondary | ICD-10-CM | POA: Diagnosis not present

## 2021-01-27 DIAGNOSIS — E890 Postprocedural hypothyroidism: Secondary | ICD-10-CM | POA: Diagnosis not present

## 2021-05-20 DIAGNOSIS — E119 Type 2 diabetes mellitus without complications: Secondary | ICD-10-CM | POA: Diagnosis not present

## 2021-05-20 DIAGNOSIS — E7800 Pure hypercholesterolemia, unspecified: Secondary | ICD-10-CM | POA: Diagnosis not present

## 2021-05-20 DIAGNOSIS — E559 Vitamin D deficiency, unspecified: Secondary | ICD-10-CM | POA: Diagnosis not present

## 2021-05-20 DIAGNOSIS — E890 Postprocedural hypothyroidism: Secondary | ICD-10-CM | POA: Diagnosis not present

## 2021-05-27 DIAGNOSIS — E559 Vitamin D deficiency, unspecified: Secondary | ICD-10-CM | POA: Diagnosis not present

## 2021-05-27 DIAGNOSIS — E890 Postprocedural hypothyroidism: Secondary | ICD-10-CM | POA: Diagnosis not present

## 2021-05-27 DIAGNOSIS — E119 Type 2 diabetes mellitus without complications: Secondary | ICD-10-CM | POA: Diagnosis not present

## 2021-05-27 DIAGNOSIS — E7800 Pure hypercholesterolemia, unspecified: Secondary | ICD-10-CM | POA: Diagnosis not present

## 2021-07-20 DIAGNOSIS — M25512 Pain in left shoulder: Secondary | ICD-10-CM | POA: Diagnosis not present

## 2021-10-01 DIAGNOSIS — M159 Polyosteoarthritis, unspecified: Secondary | ICD-10-CM | POA: Diagnosis not present

## 2021-10-01 DIAGNOSIS — Z6829 Body mass index (BMI) 29.0-29.9, adult: Secondary | ICD-10-CM | POA: Diagnosis not present

## 2021-10-01 DIAGNOSIS — M75102 Unspecified rotator cuff tear or rupture of left shoulder, not specified as traumatic: Secondary | ICD-10-CM | POA: Diagnosis not present

## 2021-10-01 DIAGNOSIS — E663 Overweight: Secondary | ICD-10-CM | POA: Diagnosis not present

## 2021-10-01 DIAGNOSIS — Z1331 Encounter for screening for depression: Secondary | ICD-10-CM | POA: Diagnosis not present

## 2021-10-01 DIAGNOSIS — E063 Autoimmune thyroiditis: Secondary | ICD-10-CM | POA: Diagnosis not present

## 2021-10-01 DIAGNOSIS — Z23 Encounter for immunization: Secondary | ICD-10-CM | POA: Diagnosis not present

## 2021-10-02 DIAGNOSIS — N393 Stress incontinence (female) (male): Secondary | ICD-10-CM | POA: Diagnosis not present

## 2021-10-02 DIAGNOSIS — Z833 Family history of diabetes mellitus: Secondary | ICD-10-CM | POA: Diagnosis not present

## 2021-10-02 DIAGNOSIS — Z809 Family history of malignant neoplasm, unspecified: Secondary | ICD-10-CM | POA: Diagnosis not present

## 2021-10-02 DIAGNOSIS — E663 Overweight: Secondary | ICD-10-CM | POA: Diagnosis not present

## 2021-10-02 DIAGNOSIS — R030 Elevated blood-pressure reading, without diagnosis of hypertension: Secondary | ICD-10-CM | POA: Diagnosis not present

## 2021-10-02 DIAGNOSIS — Z720 Tobacco use: Secondary | ICD-10-CM | POA: Diagnosis not present

## 2021-10-02 DIAGNOSIS — E039 Hypothyroidism, unspecified: Secondary | ICD-10-CM | POA: Diagnosis not present

## 2021-10-02 DIAGNOSIS — Z8249 Family history of ischemic heart disease and other diseases of the circulatory system: Secondary | ICD-10-CM | POA: Diagnosis not present

## 2021-10-02 DIAGNOSIS — Z6829 Body mass index (BMI) 29.0-29.9, adult: Secondary | ICD-10-CM | POA: Diagnosis not present

## 2021-10-02 DIAGNOSIS — E119 Type 2 diabetes mellitus without complications: Secondary | ICD-10-CM | POA: Diagnosis not present

## 2021-11-18 DIAGNOSIS — E119 Type 2 diabetes mellitus without complications: Secondary | ICD-10-CM | POA: Diagnosis not present

## 2021-11-18 DIAGNOSIS — H2513 Age-related nuclear cataract, bilateral: Secondary | ICD-10-CM | POA: Diagnosis not present

## 2021-11-18 DIAGNOSIS — H5203 Hypermetropia, bilateral: Secondary | ICD-10-CM | POA: Diagnosis not present

## 2021-11-18 DIAGNOSIS — H524 Presbyopia: Secondary | ICD-10-CM | POA: Diagnosis not present

## 2021-11-26 DIAGNOSIS — E559 Vitamin D deficiency, unspecified: Secondary | ICD-10-CM | POA: Diagnosis not present

## 2021-11-26 DIAGNOSIS — E890 Postprocedural hypothyroidism: Secondary | ICD-10-CM | POA: Diagnosis not present

## 2021-11-26 DIAGNOSIS — E7800 Pure hypercholesterolemia, unspecified: Secondary | ICD-10-CM | POA: Diagnosis not present

## 2021-11-26 DIAGNOSIS — E119 Type 2 diabetes mellitus without complications: Secondary | ICD-10-CM | POA: Diagnosis not present

## 2021-12-02 DIAGNOSIS — E559 Vitamin D deficiency, unspecified: Secondary | ICD-10-CM | POA: Diagnosis not present

## 2021-12-02 DIAGNOSIS — E890 Postprocedural hypothyroidism: Secondary | ICD-10-CM | POA: Diagnosis not present

## 2021-12-02 DIAGNOSIS — E7800 Pure hypercholesterolemia, unspecified: Secondary | ICD-10-CM | POA: Diagnosis not present

## 2021-12-02 DIAGNOSIS — E119 Type 2 diabetes mellitus without complications: Secondary | ICD-10-CM | POA: Diagnosis not present

## 2022-02-18 DIAGNOSIS — E890 Postprocedural hypothyroidism: Secondary | ICD-10-CM | POA: Diagnosis not present

## 2022-03-23 DIAGNOSIS — Z833 Family history of diabetes mellitus: Secondary | ICD-10-CM | POA: Diagnosis not present

## 2022-03-23 DIAGNOSIS — E119 Type 2 diabetes mellitus without complications: Secondary | ICD-10-CM | POA: Diagnosis not present

## 2022-03-23 DIAGNOSIS — N393 Stress incontinence (female) (male): Secondary | ICD-10-CM | POA: Diagnosis not present

## 2022-03-23 DIAGNOSIS — E785 Hyperlipidemia, unspecified: Secondary | ICD-10-CM | POA: Diagnosis not present

## 2022-03-23 DIAGNOSIS — Z823 Family history of stroke: Secondary | ICD-10-CM | POA: Diagnosis not present

## 2022-03-23 DIAGNOSIS — R030 Elevated blood-pressure reading, without diagnosis of hypertension: Secondary | ICD-10-CM | POA: Diagnosis not present

## 2022-03-23 DIAGNOSIS — Z720 Tobacco use: Secondary | ICD-10-CM | POA: Diagnosis not present

## 2022-03-23 DIAGNOSIS — E039 Hypothyroidism, unspecified: Secondary | ICD-10-CM | POA: Diagnosis not present

## 2022-05-26 DIAGNOSIS — E559 Vitamin D deficiency, unspecified: Secondary | ICD-10-CM | POA: Diagnosis not present

## 2022-05-26 DIAGNOSIS — E890 Postprocedural hypothyroidism: Secondary | ICD-10-CM | POA: Diagnosis not present

## 2022-05-26 DIAGNOSIS — E119 Type 2 diabetes mellitus without complications: Secondary | ICD-10-CM | POA: Diagnosis not present

## 2022-05-26 DIAGNOSIS — E7800 Pure hypercholesterolemia, unspecified: Secondary | ICD-10-CM | POA: Diagnosis not present

## 2022-06-02 DIAGNOSIS — E119 Type 2 diabetes mellitus without complications: Secondary | ICD-10-CM | POA: Diagnosis not present

## 2022-06-02 DIAGNOSIS — E559 Vitamin D deficiency, unspecified: Secondary | ICD-10-CM | POA: Diagnosis not present

## 2022-06-02 DIAGNOSIS — E890 Postprocedural hypothyroidism: Secondary | ICD-10-CM | POA: Diagnosis not present

## 2022-06-02 DIAGNOSIS — E7800 Pure hypercholesterolemia, unspecified: Secondary | ICD-10-CM | POA: Diagnosis not present

## 2022-06-02 DIAGNOSIS — M791 Myalgia, unspecified site: Secondary | ICD-10-CM | POA: Diagnosis not present

## 2022-06-02 DIAGNOSIS — R5383 Other fatigue: Secondary | ICD-10-CM | POA: Diagnosis not present

## 2022-06-04 DIAGNOSIS — R5383 Other fatigue: Secondary | ICD-10-CM | POA: Diagnosis not present

## 2022-06-04 DIAGNOSIS — M791 Myalgia, unspecified site: Secondary | ICD-10-CM | POA: Diagnosis not present

## 2022-06-28 DIAGNOSIS — H6121 Impacted cerumen, right ear: Secondary | ICD-10-CM | POA: Diagnosis not present

## 2022-06-28 DIAGNOSIS — Z6830 Body mass index (BMI) 30.0-30.9, adult: Secondary | ICD-10-CM | POA: Diagnosis not present

## 2022-06-28 DIAGNOSIS — E6609 Other obesity due to excess calories: Secondary | ICD-10-CM | POA: Diagnosis not present

## 2022-07-22 DIAGNOSIS — M25551 Pain in right hip: Secondary | ICD-10-CM | POA: Diagnosis not present

## 2022-07-27 DIAGNOSIS — Z1331 Encounter for screening for depression: Secondary | ICD-10-CM | POA: Diagnosis not present

## 2022-07-27 DIAGNOSIS — Z0001 Encounter for general adult medical examination with abnormal findings: Secondary | ICD-10-CM | POA: Diagnosis not present

## 2022-07-27 DIAGNOSIS — E559 Vitamin D deficiency, unspecified: Secondary | ICD-10-CM | POA: Diagnosis not present

## 2022-07-27 DIAGNOSIS — E278 Other specified disorders of adrenal gland: Secondary | ICD-10-CM | POA: Diagnosis not present

## 2022-07-27 DIAGNOSIS — Z6830 Body mass index (BMI) 30.0-30.9, adult: Secondary | ICD-10-CM | POA: Diagnosis not present

## 2022-07-27 DIAGNOSIS — E119 Type 2 diabetes mellitus without complications: Secondary | ICD-10-CM | POA: Diagnosis not present

## 2022-07-27 DIAGNOSIS — M159 Polyosteoarthritis, unspecified: Secondary | ICD-10-CM | POA: Diagnosis not present

## 2022-07-27 DIAGNOSIS — E6609 Other obesity due to excess calories: Secondary | ICD-10-CM | POA: Diagnosis not present

## 2022-07-27 DIAGNOSIS — E063 Autoimmune thyroiditis: Secondary | ICD-10-CM | POA: Diagnosis not present

## 2022-07-27 DIAGNOSIS — D518 Other vitamin B12 deficiency anemias: Secondary | ICD-10-CM | POA: Diagnosis not present

## 2022-07-27 DIAGNOSIS — K219 Gastro-esophageal reflux disease without esophagitis: Secondary | ICD-10-CM | POA: Diagnosis not present

## 2022-09-03 DIAGNOSIS — J069 Acute upper respiratory infection, unspecified: Secondary | ICD-10-CM | POA: Diagnosis not present

## 2022-09-03 DIAGNOSIS — E6609 Other obesity due to excess calories: Secondary | ICD-10-CM | POA: Diagnosis not present

## 2022-09-03 DIAGNOSIS — Z6830 Body mass index (BMI) 30.0-30.9, adult: Secondary | ICD-10-CM | POA: Diagnosis not present

## 2022-09-03 DIAGNOSIS — Z20828 Contact with and (suspected) exposure to other viral communicable diseases: Secondary | ICD-10-CM | POA: Diagnosis not present

## 2022-09-20 DIAGNOSIS — E6609 Other obesity due to excess calories: Secondary | ICD-10-CM | POA: Diagnosis not present

## 2022-09-20 DIAGNOSIS — E063 Autoimmune thyroiditis: Secondary | ICD-10-CM | POA: Diagnosis not present

## 2022-09-20 DIAGNOSIS — Z6830 Body mass index (BMI) 30.0-30.9, adult: Secondary | ICD-10-CM | POA: Diagnosis not present

## 2022-09-20 DIAGNOSIS — E119 Type 2 diabetes mellitus without complications: Secondary | ICD-10-CM | POA: Diagnosis not present

## 2022-09-20 DIAGNOSIS — J45909 Unspecified asthma, uncomplicated: Secondary | ICD-10-CM | POA: Diagnosis not present

## 2022-09-20 DIAGNOSIS — J320 Chronic maxillary sinusitis: Secondary | ICD-10-CM | POA: Diagnosis not present

## 2022-09-20 DIAGNOSIS — K219 Gastro-esophageal reflux disease without esophagitis: Secondary | ICD-10-CM | POA: Diagnosis not present

## 2022-09-28 DIAGNOSIS — E119 Type 2 diabetes mellitus without complications: Secondary | ICD-10-CM | POA: Diagnosis not present

## 2022-09-28 DIAGNOSIS — E890 Postprocedural hypothyroidism: Secondary | ICD-10-CM | POA: Diagnosis not present

## 2022-09-28 DIAGNOSIS — E7800 Pure hypercholesterolemia, unspecified: Secondary | ICD-10-CM | POA: Diagnosis not present

## 2022-10-05 DIAGNOSIS — E559 Vitamin D deficiency, unspecified: Secondary | ICD-10-CM | POA: Diagnosis not present

## 2022-10-05 DIAGNOSIS — E7800 Pure hypercholesterolemia, unspecified: Secondary | ICD-10-CM | POA: Diagnosis not present

## 2022-10-05 DIAGNOSIS — E890 Postprocedural hypothyroidism: Secondary | ICD-10-CM | POA: Diagnosis not present

## 2022-10-05 DIAGNOSIS — E119 Type 2 diabetes mellitus without complications: Secondary | ICD-10-CM | POA: Diagnosis not present

## 2022-11-15 DIAGNOSIS — Z23 Encounter for immunization: Secondary | ICD-10-CM | POA: Diagnosis not present

## 2023-01-11 DIAGNOSIS — H2513 Age-related nuclear cataract, bilateral: Secondary | ICD-10-CM | POA: Diagnosis not present

## 2023-01-11 DIAGNOSIS — H52223 Regular astigmatism, bilateral: Secondary | ICD-10-CM | POA: Diagnosis not present

## 2023-01-11 DIAGNOSIS — E119 Type 2 diabetes mellitus without complications: Secondary | ICD-10-CM | POA: Diagnosis not present

## 2023-01-11 DIAGNOSIS — H5203 Hypermetropia, bilateral: Secondary | ICD-10-CM | POA: Diagnosis not present

## 2023-01-11 DIAGNOSIS — H1045 Other chronic allergic conjunctivitis: Secondary | ICD-10-CM | POA: Diagnosis not present

## 2023-02-25 DIAGNOSIS — Z6830 Body mass index (BMI) 30.0-30.9, adult: Secondary | ICD-10-CM | POA: Diagnosis not present

## 2023-02-25 DIAGNOSIS — E6609 Other obesity due to excess calories: Secondary | ICD-10-CM | POA: Diagnosis not present

## 2023-02-25 DIAGNOSIS — M159 Polyosteoarthritis, unspecified: Secondary | ICD-10-CM | POA: Diagnosis not present

## 2023-02-25 DIAGNOSIS — J320 Chronic maxillary sinusitis: Secondary | ICD-10-CM | POA: Diagnosis not present

## 2023-02-25 DIAGNOSIS — K219 Gastro-esophageal reflux disease without esophagitis: Secondary | ICD-10-CM | POA: Diagnosis not present

## 2023-02-25 DIAGNOSIS — E278 Other specified disorders of adrenal gland: Secondary | ICD-10-CM | POA: Diagnosis not present

## 2023-04-06 DIAGNOSIS — E890 Postprocedural hypothyroidism: Secondary | ICD-10-CM | POA: Diagnosis not present

## 2023-04-06 DIAGNOSIS — E559 Vitamin D deficiency, unspecified: Secondary | ICD-10-CM | POA: Diagnosis not present

## 2023-04-06 DIAGNOSIS — E7800 Pure hypercholesterolemia, unspecified: Secondary | ICD-10-CM | POA: Diagnosis not present

## 2023-04-06 DIAGNOSIS — E119 Type 2 diabetes mellitus without complications: Secondary | ICD-10-CM | POA: Diagnosis not present

## 2023-04-13 DIAGNOSIS — E119 Type 2 diabetes mellitus without complications: Secondary | ICD-10-CM | POA: Diagnosis not present

## 2023-04-13 DIAGNOSIS — E890 Postprocedural hypothyroidism: Secondary | ICD-10-CM | POA: Diagnosis not present

## 2023-04-13 DIAGNOSIS — E7800 Pure hypercholesterolemia, unspecified: Secondary | ICD-10-CM | POA: Diagnosis not present

## 2023-04-13 DIAGNOSIS — E559 Vitamin D deficiency, unspecified: Secondary | ICD-10-CM | POA: Diagnosis not present

## 2023-06-17 DIAGNOSIS — H25812 Combined forms of age-related cataract, left eye: Secondary | ICD-10-CM | POA: Diagnosis not present

## 2023-06-17 DIAGNOSIS — H25811 Combined forms of age-related cataract, right eye: Secondary | ICD-10-CM | POA: Diagnosis not present

## 2023-08-08 DIAGNOSIS — Z0001 Encounter for general adult medical examination with abnormal findings: Secondary | ICD-10-CM | POA: Diagnosis not present

## 2023-08-08 DIAGNOSIS — E119 Type 2 diabetes mellitus without complications: Secondary | ICD-10-CM | POA: Diagnosis not present

## 2023-08-08 DIAGNOSIS — Z6830 Body mass index (BMI) 30.0-30.9, adult: Secondary | ICD-10-CM | POA: Diagnosis not present

## 2023-08-08 DIAGNOSIS — E785 Hyperlipidemia, unspecified: Secondary | ICD-10-CM | POA: Diagnosis not present

## 2023-08-08 DIAGNOSIS — E6609 Other obesity due to excess calories: Secondary | ICD-10-CM | POA: Diagnosis not present

## 2023-08-08 DIAGNOSIS — E063 Autoimmune thyroiditis: Secondary | ICD-10-CM | POA: Diagnosis not present

## 2023-08-08 DIAGNOSIS — K219 Gastro-esophageal reflux disease without esophagitis: Secondary | ICD-10-CM | POA: Diagnosis not present

## 2023-08-08 DIAGNOSIS — Z1331 Encounter for screening for depression: Secondary | ICD-10-CM | POA: Diagnosis not present

## 2023-08-08 DIAGNOSIS — Z9229 Personal history of other drug therapy: Secondary | ICD-10-CM | POA: Diagnosis not present

## 2023-08-08 DIAGNOSIS — J302 Other seasonal allergic rhinitis: Secondary | ICD-10-CM | POA: Diagnosis not present

## 2023-08-08 DIAGNOSIS — M159 Polyosteoarthritis, unspecified: Secondary | ICD-10-CM | POA: Diagnosis not present

## 2023-08-17 DIAGNOSIS — H25812 Combined forms of age-related cataract, left eye: Secondary | ICD-10-CM | POA: Diagnosis not present

## 2023-09-02 DIAGNOSIS — E6609 Other obesity due to excess calories: Secondary | ICD-10-CM | POA: Diagnosis not present

## 2023-09-02 DIAGNOSIS — Z6831 Body mass index (BMI) 31.0-31.9, adult: Secondary | ICD-10-CM | POA: Diagnosis not present

## 2023-09-02 DIAGNOSIS — N390 Urinary tract infection, site not specified: Secondary | ICD-10-CM | POA: Diagnosis not present

## 2023-09-14 DIAGNOSIS — H25811 Combined forms of age-related cataract, right eye: Secondary | ICD-10-CM | POA: Diagnosis not present

## 2023-10-06 DIAGNOSIS — H52221 Regular astigmatism, right eye: Secondary | ICD-10-CM | POA: Diagnosis not present

## 2023-10-06 DIAGNOSIS — H524 Presbyopia: Secondary | ICD-10-CM | POA: Diagnosis not present

## 2023-10-13 DIAGNOSIS — E119 Type 2 diabetes mellitus without complications: Secondary | ICD-10-CM | POA: Diagnosis not present

## 2023-10-13 DIAGNOSIS — E7800 Pure hypercholesterolemia, unspecified: Secondary | ICD-10-CM | POA: Diagnosis not present

## 2023-10-13 DIAGNOSIS — E890 Postprocedural hypothyroidism: Secondary | ICD-10-CM | POA: Diagnosis not present

## 2023-10-13 DIAGNOSIS — E559 Vitamin D deficiency, unspecified: Secondary | ICD-10-CM | POA: Diagnosis not present

## 2023-11-02 DIAGNOSIS — Z008 Encounter for other general examination: Secondary | ICD-10-CM | POA: Diagnosis not present

## 2023-11-09 DIAGNOSIS — Z23 Encounter for immunization: Secondary | ICD-10-CM | POA: Diagnosis not present

## 2024-01-31 DIAGNOSIS — M9903 Segmental and somatic dysfunction of lumbar region: Secondary | ICD-10-CM | POA: Diagnosis not present

## 2024-01-31 DIAGNOSIS — M9902 Segmental and somatic dysfunction of thoracic region: Secondary | ICD-10-CM | POA: Diagnosis not present

## 2024-01-31 DIAGNOSIS — M9905 Segmental and somatic dysfunction of pelvic region: Secondary | ICD-10-CM | POA: Diagnosis not present

## 2024-01-31 DIAGNOSIS — M5441 Lumbago with sciatica, right side: Secondary | ICD-10-CM | POA: Diagnosis not present

## 2024-02-06 DIAGNOSIS — M9905 Segmental and somatic dysfunction of pelvic region: Secondary | ICD-10-CM | POA: Diagnosis not present

## 2024-02-06 DIAGNOSIS — M9903 Segmental and somatic dysfunction of lumbar region: Secondary | ICD-10-CM | POA: Diagnosis not present

## 2024-02-06 DIAGNOSIS — M5441 Lumbago with sciatica, right side: Secondary | ICD-10-CM | POA: Diagnosis not present

## 2024-02-06 DIAGNOSIS — M9902 Segmental and somatic dysfunction of thoracic region: Secondary | ICD-10-CM | POA: Diagnosis not present

## 2024-02-09 DIAGNOSIS — K2100 Gastro-esophageal reflux disease with esophagitis, without bleeding: Secondary | ICD-10-CM | POA: Diagnosis not present

## 2024-02-09 DIAGNOSIS — Z6830 Body mass index (BMI) 30.0-30.9, adult: Secondary | ICD-10-CM | POA: Diagnosis not present

## 2024-02-09 DIAGNOSIS — M26629 Arthralgia of temporomandibular joint, unspecified side: Secondary | ICD-10-CM | POA: Diagnosis not present

## 2024-02-09 DIAGNOSIS — M159 Polyosteoarthritis, unspecified: Secondary | ICD-10-CM | POA: Diagnosis not present

## 2024-02-09 DIAGNOSIS — E6609 Other obesity due to excess calories: Secondary | ICD-10-CM | POA: Diagnosis not present

## 2024-02-09 DIAGNOSIS — J320 Chronic maxillary sinusitis: Secondary | ICD-10-CM | POA: Diagnosis not present

## 2024-02-09 DIAGNOSIS — Z9229 Personal history of other drug therapy: Secondary | ICD-10-CM | POA: Diagnosis not present

## 2024-02-13 DIAGNOSIS — M9902 Segmental and somatic dysfunction of thoracic region: Secondary | ICD-10-CM | POA: Diagnosis not present

## 2024-02-13 DIAGNOSIS — M9903 Segmental and somatic dysfunction of lumbar region: Secondary | ICD-10-CM | POA: Diagnosis not present

## 2024-02-13 DIAGNOSIS — M5441 Lumbago with sciatica, right side: Secondary | ICD-10-CM | POA: Diagnosis not present

## 2024-02-13 DIAGNOSIS — M9905 Segmental and somatic dysfunction of pelvic region: Secondary | ICD-10-CM | POA: Diagnosis not present

## 2024-02-16 DIAGNOSIS — K2100 Gastro-esophageal reflux disease with esophagitis, without bleeding: Secondary | ICD-10-CM | POA: Diagnosis not present

## 2024-02-20 DIAGNOSIS — M9902 Segmental and somatic dysfunction of thoracic region: Secondary | ICD-10-CM | POA: Diagnosis not present

## 2024-02-20 DIAGNOSIS — M5441 Lumbago with sciatica, right side: Secondary | ICD-10-CM | POA: Diagnosis not present

## 2024-02-20 DIAGNOSIS — M9903 Segmental and somatic dysfunction of lumbar region: Secondary | ICD-10-CM | POA: Diagnosis not present

## 2024-02-20 DIAGNOSIS — M9905 Segmental and somatic dysfunction of pelvic region: Secondary | ICD-10-CM | POA: Diagnosis not present

## 2024-03-01 ENCOUNTER — Ambulatory Visit (HOSPITAL_COMMUNITY): Admission: RE | Admit: 2024-03-01 | Source: Ambulatory Visit

## 2024-03-01 ENCOUNTER — Ambulatory Visit (HOSPITAL_COMMUNITY)
Admission: RE | Admit: 2024-03-01 | Discharge: 2024-03-01 | Disposition: A | Discharge status: Home / Self Care | Source: Ambulatory Visit | Attending: Internal Medicine | Admitting: Internal Medicine

## 2024-03-01 ENCOUNTER — Other Ambulatory Visit (HOSPITAL_COMMUNITY): Payer: Self-pay | Admitting: Internal Medicine

## 2024-03-01 DIAGNOSIS — R0789 Other chest pain: Secondary | ICD-10-CM

## 2024-03-01 DIAGNOSIS — M26629 Arthralgia of temporomandibular joint, unspecified side: Secondary | ICD-10-CM | POA: Diagnosis not present

## 2024-03-01 DIAGNOSIS — E6609 Other obesity due to excess calories: Secondary | ICD-10-CM | POA: Diagnosis not present

## 2024-03-01 DIAGNOSIS — Z6830 Body mass index (BMI) 30.0-30.9, adult: Secondary | ICD-10-CM | POA: Diagnosis not present

## 2024-03-01 DIAGNOSIS — M542 Cervicalgia: Secondary | ICD-10-CM | POA: Diagnosis not present

## 2024-03-01 DIAGNOSIS — K2100 Gastro-esophageal reflux disease with esophagitis, without bleeding: Secondary | ICD-10-CM | POA: Diagnosis not present

## 2024-03-01 DIAGNOSIS — R7303 Prediabetes: Secondary | ICD-10-CM | POA: Diagnosis not present

## 2024-03-02 ENCOUNTER — Encounter (HOSPITAL_COMMUNITY): Payer: Self-pay | Admitting: Internal Medicine

## 2024-03-08 ENCOUNTER — Other Ambulatory Visit: Payer: Self-pay | Admitting: Internal Medicine

## 2024-03-08 DIAGNOSIS — M546 Pain in thoracic spine: Secondary | ICD-10-CM | POA: Diagnosis not present

## 2024-03-08 DIAGNOSIS — Z1231 Encounter for screening mammogram for malignant neoplasm of breast: Secondary | ICD-10-CM

## 2024-03-08 DIAGNOSIS — M9902 Segmental and somatic dysfunction of thoracic region: Secondary | ICD-10-CM | POA: Diagnosis not present

## 2024-03-08 DIAGNOSIS — M542 Cervicalgia: Secondary | ICD-10-CM | POA: Diagnosis not present

## 2024-03-08 DIAGNOSIS — M9903 Segmental and somatic dysfunction of lumbar region: Secondary | ICD-10-CM | POA: Diagnosis not present

## 2024-03-08 DIAGNOSIS — M9901 Segmental and somatic dysfunction of cervical region: Secondary | ICD-10-CM | POA: Diagnosis not present

## 2024-03-14 ENCOUNTER — Ambulatory Visit (HOSPITAL_COMMUNITY)
Admission: RE | Admit: 2024-03-14 | Discharge: 2024-03-14 | Disposition: A | Discharge status: Home / Self Care | Source: Ambulatory Visit | Attending: Internal Medicine | Admitting: Internal Medicine

## 2024-03-14 DIAGNOSIS — M546 Pain in thoracic spine: Secondary | ICD-10-CM | POA: Diagnosis not present

## 2024-03-14 DIAGNOSIS — R0789 Other chest pain: Secondary | ICD-10-CM | POA: Diagnosis not present

## 2024-03-14 DIAGNOSIS — M542 Cervicalgia: Secondary | ICD-10-CM | POA: Diagnosis not present

## 2024-03-14 DIAGNOSIS — M9901 Segmental and somatic dysfunction of cervical region: Secondary | ICD-10-CM | POA: Diagnosis not present

## 2024-03-14 DIAGNOSIS — I700 Atherosclerosis of aorta: Secondary | ICD-10-CM | POA: Diagnosis not present

## 2024-03-14 DIAGNOSIS — M9903 Segmental and somatic dysfunction of lumbar region: Secondary | ICD-10-CM | POA: Diagnosis not present

## 2024-03-14 DIAGNOSIS — M9902 Segmental and somatic dysfunction of thoracic region: Secondary | ICD-10-CM | POA: Diagnosis not present

## 2024-03-14 MED ORDER — IOHEXOL 300 MG/ML  SOLN
80.0000 mL | Freq: Once | INTRAMUSCULAR | Status: AC | PRN
  Administered 2024-03-14: 80 mL via INTRAVENOUS

## 2024-03-20 ENCOUNTER — Emergency Department (HOSPITAL_COMMUNITY): Admission: EM | Admit: 2024-03-20 | Discharge: 2024-03-21 | Disposition: A | Discharge status: Home / Self Care

## 2024-03-20 ENCOUNTER — Encounter (HOSPITAL_COMMUNITY): Payer: Self-pay | Admitting: Emergency Medicine

## 2024-03-20 ENCOUNTER — Other Ambulatory Visit: Payer: Self-pay

## 2024-03-20 ENCOUNTER — Emergency Department (HOSPITAL_COMMUNITY)

## 2024-03-20 DIAGNOSIS — R112 Nausea with vomiting, unspecified: Secondary | ICD-10-CM

## 2024-03-20 DIAGNOSIS — I708 Atherosclerosis of other arteries: Secondary | ICD-10-CM | POA: Diagnosis not present

## 2024-03-20 DIAGNOSIS — I771 Stricture of artery: Secondary | ICD-10-CM

## 2024-03-20 DIAGNOSIS — Z7982 Long term (current) use of aspirin: Secondary | ICD-10-CM | POA: Diagnosis not present

## 2024-03-20 DIAGNOSIS — I671 Cerebral aneurysm, nonruptured: Secondary | ICD-10-CM | POA: Diagnosis not present

## 2024-03-20 DIAGNOSIS — R9431 Abnormal electrocardiogram [ECG] [EKG]: Secondary | ICD-10-CM | POA: Diagnosis not present

## 2024-03-20 DIAGNOSIS — M542 Cervicalgia: Secondary | ICD-10-CM | POA: Diagnosis present

## 2024-03-20 DIAGNOSIS — N281 Cyst of kidney, acquired: Secondary | ICD-10-CM | POA: Diagnosis not present

## 2024-03-20 DIAGNOSIS — I700 Atherosclerosis of aorta: Secondary | ICD-10-CM | POA: Diagnosis not present

## 2024-03-20 DIAGNOSIS — I6521 Occlusion and stenosis of right carotid artery: Secondary | ICD-10-CM | POA: Diagnosis not present

## 2024-03-20 DIAGNOSIS — N390 Urinary tract infection, site not specified: Secondary | ICD-10-CM

## 2024-03-20 LAB — COMPREHENSIVE METABOLIC PANEL WITH GFR
ALT: 17 U/L (ref 0–44)
AST: 20 U/L (ref 15–41)
Albumin: 4.2 g/dL (ref 3.5–5.0)
Alkaline Phosphatase: 49 U/L (ref 38–126)
Anion gap: 10 (ref 5–15)
BUN: 18 mg/dL (ref 8–23)
CO2: 24 mmol/L (ref 22–32)
Calcium: 9.2 mg/dL (ref 8.9–10.3)
Chloride: 107 mmol/L (ref 98–111)
Creatinine, Ser: 1.04 mg/dL — ABNORMAL HIGH (ref 0.44–1.00)
GFR, Estimated: 55 mL/min — ABNORMAL LOW (ref >60)
Glucose, Bld: 142 mg/dL — ABNORMAL HIGH (ref 70–99)
Potassium: 4.2 mmol/L (ref 3.5–5.1)
Sodium: 141 mmol/L (ref 135–145)
Total Bilirubin: 0.3 mg/dL (ref 0.0–1.2)
Total Protein: 8.1 g/dL (ref 6.5–8.1)

## 2024-03-20 LAB — CBC
HCT: 41.7 % (ref 36.0–46.0)
Hemoglobin: 13.9 g/dL (ref 12.0–15.0)
MCH: 32.2 pg (ref 26.0–34.0)
MCHC: 33.3 g/dL (ref 30.0–36.0)
MCV: 96.5 fL (ref 80.0–100.0)
Platelets: 272 10*3/uL (ref 150–400)
RBC: 4.32 MIL/uL (ref 3.87–5.11)
RDW: 13.5 % (ref 11.5–15.5)
WBC: 8.1 10*3/uL (ref 4.0–10.5)
nRBC: 0 % (ref 0.0–0.2)

## 2024-03-20 LAB — URINALYSIS, ROUTINE W REFLEX MICROSCOPIC
Bilirubin Urine: NEGATIVE
Glucose, UA: NEGATIVE mg/dL
Hgb urine dipstick: NEGATIVE
Ketones, ur: NEGATIVE mg/dL
Nitrite: NEGATIVE
Protein, ur: NEGATIVE mg/dL
Specific Gravity, Urine: 1.011 (ref 1.005–1.030)
pH: 6 (ref 5.0–8.0)

## 2024-03-20 LAB — TROPONIN I (HIGH SENSITIVITY): Troponin I (High Sensitivity): 3 ng/L (ref <18)

## 2024-03-20 LAB — LIPASE, BLOOD: Lipase: 50 U/L (ref 11–51)

## 2024-03-20 MED ORDER — ONDANSETRON HCL 4 MG/2ML IJ SOLN
4.0000 mg | Freq: Once | INTRAMUSCULAR | Status: AC
  Administered 2024-03-20: 4 mg via INTRAVENOUS
  Filled 2024-03-20: qty 2

## 2024-03-20 MED ORDER — CEPHALEXIN 500 MG PO CAPS
500.0000 mg | ORAL_CAPSULE | Freq: Once | ORAL | Status: AC
  Administered 2024-03-21: 500 mg via ORAL
  Filled 2024-03-20: qty 1

## 2024-03-20 MED ORDER — IOHEXOL 350 MG/ML SOLN
100.0000 mL | Freq: Once | INTRAVENOUS | Status: AC | PRN
  Administered 2024-03-20: 100 mL via INTRAVENOUS

## 2024-03-20 MED ORDER — KETOROLAC TROMETHAMINE 15 MG/ML IJ SOLN
7.5000 mg | Freq: Once | INTRAMUSCULAR | Status: AC
  Administered 2024-03-21: 7.5 mg via INTRAVENOUS
  Filled 2024-03-20: qty 1

## 2024-03-20 NOTE — ED Triage Notes (Signed)
 Pt presents to the ED via POV with complaints of Emesis, possible TMJ, and neck pain. Pt has been seen by her PCP for same this past week and her tests have been unremarkable. Pt states that her sx started off as reflux and progressed to emesis. Last episode was yesterday and typically follows the consumption of a meal. A&Ox4 at this time. Denies CP or SOB.

## 2024-03-20 NOTE — ED Provider Notes (Signed)
 [place] [facility]GENCY DEPARTMENT AT [facility] Provider Note   CSN: [id number] Arrival date & time: 03/20/24  1948     History {Add pertinent medical, surgical, social history, OB history to HPI:1} Chief Complaint  Patient presents with   Emesis    Ana Gutierrez is a 78 y.o. female.  78 year old female with past medical history of GERD presenting to the emergency department today with some episodes of nausea and vomiting.  The patient has been having his episodes now for the past 3 to 4 weeks.  The patient has seen her primary care doctor and was recently started on Dexilant.  She was also given Carafate.  She had an H. pylori test that was negative.  EKG in the primary care office was apparently unremarkable.  The patient states she has been having intermittent headaches now for the past few weeks as well that come with these episodes.  She states that she has occasional pain in her neck as well.  She has seen a chiropractor regarding this as well.  She reports that she was having further vomiting this evening so she was sent to the ER by her primary care doctor for reevaluation.  The patient apparently had a CT scan of her abdomen done as an outpatient but we do not have access to this.  She did call to check on this and apparently this was not read yet.  She denies any specific abdominal pain with this but states occasionally she does have some upper abdominal discomfort that she is not having any of this currently.  She states that she occasionally does have some pain going into her jaws as well.   Emesis Associated symptoms: headaches        Home Medications Prior to Admission medications   Medication Sig Start Date End Date Taking? Authorizing Provider  Ascorbic Acid (VITAMIN C PO) Take by mouth.    [provider]  b complex vitamins tablet Take 1 tablet by mouth daily.    [provider]  Cholecalciferol (VITAMIN D3 PO) Take by mouth.     [provider]  Coenzyme Q10 (CO Q 10) 100 MG CAPS Take 100 mg by mouth daily.     [provider]  COLLAGEN PO Take by mouth.    [provider]  ELDERBERRY PO Take by mouth.    [provider]  fish oil-omega-3 fatty acids 1000 MG capsule Take 2 g by mouth daily.     [provider]  levothyroxine (EUTHYROX) 112 MCG tablet Euthyrox 112 mcg tablet    [provider]  Multiple Vitamins-Minerals (ZINC PO) Take by mouth.    [provider]  pantoprazole (PROTONIX) 40 MG tablet Take 1 tablet (40 mg total) by mouth 2 (two) times daily before a meal. Before breakfast + supper 07/19/17   Ana Boop, MD  Probiotic Product (ACIDOPHILUS/BIFIDUS PO) Take by mouth.    [provider]  UNABLE TO FIND Med Name: Glucocil po    [provider]      Allergies    Crestor [rosuvastatin], Lipitor [atorvastatin], Metoprolol tartrate, and Penicillins    Review of Systems   Review of Systems  Gastrointestinal:  Positive for nausea and vomiting.  Neurological:  Positive for headaches.  All other systems reviewed and are negative.   Physical Exam Updated Vital Signs BP (!) 158/87 (BP Location: Left Arm)   Pulse 91   Temp 98 F (36.7 C) (Oral)  Resp 16   Ht 5\' 1"  (1.549 m)   Wt 78.2 kg   LMP  (LMP Unknown)   SpO2 100%   BMI 32.58 kg/m  Physical Exam Vitals and nursing note reviewed.   Gen: NAD Eyes: PERRL, EOMI HEENT: no oropharyngeal swelling Neck: trachea midline, no meningismus Resp: clear to auscultation bilaterally Card: RRR, no murmurs, rubs, or gallops Abd: Mild upper abdominal tenderness with no guarding or rebound Extremities: no calf tenderness, no edema Vascular: 2+ radial pulses bilaterally, 2+ DP pulses bilaterally Skin: no rashes Psyc: acting appropriately   ED Results / Procedures / Treatments   Labs (all labs ordered are listed, but only abnormal results are displayed) Labs Reviewed   COMPREHENSIVE METABOLIC PANEL WITH GFR - Abnormal; Notable for the following components:      Result Value   Glucose, Bld 142 (*)    Creatinine, Ser 1.04 (*)    GFR, Estimated 55 (*)    All other components within normal limits  LIPASE, BLOOD  CBC  URINALYSIS, ROUTINE W REFLEX MICROSCOPIC  TROPONIN I (HIGH SENSITIVITY)    EKG None  Radiology No results found.  Procedures Procedures  {Document cardiac monitor, telemetry assessment procedure when appropriate:1}  Medications Ordered in ED Medications  ondansetron (ZOFRAN) injection 4 mg (has no administration in time range)    ED Course/ Medical Decision Making/ A&P   {   Click here for ABCD2, HEART and other calculatorsREFRESH Note before signing :1}                              Medical Decision Making 78 year old female with past medical history of GERD presenting to the emergency department today with nausea and vomiting over the past few weeks.  This does seem to be somewhat episodic.  The patient is reporting occasional headaches as well as neck pain with this.  Based on the duration of her symptoms suspicion for meningitis or encephalitis is low at this time.  I will obtain a CT angiogram to evaluate for aneurysm.  Will also obtain the CT angiogram of her neck to evaluate for carotid dissection as she has been seeing a chiropractor for some of this neck pain.  I will also obtain basic labs here including LFTs and a lipase to evaluate for hepatobiliary pathology or pancreatitis.  Will also obtain an EKG and troponin to evaluate for atypical ACS.  I give the patient Zofran for her nausea.  Will repeat a CT scan of her abdomen as I do not have access to her outpatient imaging study.  This may be due to PUD or gastritis as well.  I discussed this with the patient.  She does have a gastroenterologist and has an appointment scheduled later this month.  I will reevaluate for ultimate disposition.  Amount and/or Complexity of Data  Reviewed Labs: ordered. Radiology: ordered.  Risk Prescription drug management.   ***  {Document critical care time when appropriate:1} {Document review of labs and clinical decision tools ie heart score, Chads2Vasc2 etc:1}  {Document your independent review of radiology images, and any outside records:1} {Document your discussion with family members, caretakers, and with consultants:1} {Document social determinants of health affecting pt's care:1} {Document your decision making why or why not admission, treatments were needed:1} Final Clinical Impression(s) / ED Diagnoses Final diagnoses:  None    Rx / DC Orders ED Discharge Orders     None

## 2024-03-21 MED ORDER — ASPIRIN 81 MG PO CHEW: 81.0000 mg | CHEWABLE_TABLET | Freq: Every day | ORAL | 0 refills | Status: AC

## 2024-03-21 MED ORDER — ONDANSETRON 4 MG PO TBDP
4.0000 mg | ORAL_TABLET | Freq: Three times a day (TID) | ORAL | 0 refills | Status: AC | PRN
Start: 2024-03-21 — End: ?

## 2024-03-21 MED ORDER — CEPHALEXIN 500 MG PO CAPS: 500.0000 mg | ORAL_CAPSULE | Freq: Four times a day (QID) | ORAL | 0 refills | Status: AC

## 2024-03-21 NOTE — Discharge Instructions (Signed)
 Your workup today was reassuring overall.  Your heart tests were within normal limits.  Please take the Keflex as prescribed for the urine infection.  Start the aspirin daily.  Take Tylenol as needed for headache.  Take the Zofran as needed for nausea.  Some of the arteries on your CT scan did appear to have some narrowing.  Dr. Randie Heinz would like you to follow-up with him in clinic.  Please call and schedule a follow-up appointment.  If you are still having any residual symptoms after the treatment of the urine infection please consider scheduling a follow-up appointment with the GI doctors at the number provided for the vomiting that you have been having.  Call your doctor or return to the ER for worsening symptoms.

## 2024-04-02 ENCOUNTER — Ambulatory Visit

## 2024-04-12 ENCOUNTER — Ambulatory Visit: Payer: Medicare HMO | Admitting: Internal Medicine

## 2024-04-12 DIAGNOSIS — E559 Vitamin D deficiency, unspecified: Secondary | ICD-10-CM | POA: Diagnosis not present

## 2024-04-12 DIAGNOSIS — E7800 Pure hypercholesterolemia, unspecified: Secondary | ICD-10-CM | POA: Diagnosis not present

## 2024-04-12 DIAGNOSIS — E119 Type 2 diabetes mellitus without complications: Secondary | ICD-10-CM | POA: Diagnosis not present

## 2024-04-12 DIAGNOSIS — E890 Postprocedural hypothyroidism: Secondary | ICD-10-CM | POA: Diagnosis not present

## 2024-04-18 ENCOUNTER — Other Ambulatory Visit: Payer: Self-pay

## 2024-04-18 DIAGNOSIS — I6523 Occlusion and stenosis of bilateral carotid arteries: Secondary | ICD-10-CM

## 2024-04-23 ENCOUNTER — Ambulatory Visit (HOSPITAL_COMMUNITY)
Admission: RE | Admit: 2024-04-23 | Discharge: 2024-04-23 | Disposition: A | Discharge status: Home / Self Care | Source: Ambulatory Visit | Attending: Surgery | Admitting: Surgery

## 2024-04-23 DIAGNOSIS — I6523 Occlusion and stenosis of bilateral carotid arteries: Secondary | ICD-10-CM | POA: Diagnosis not present

## 2024-05-02 ENCOUNTER — Ambulatory Visit: Attending: Vascular Surgery | Admitting: Vascular Surgery

## 2024-05-02 ENCOUNTER — Encounter: Payer: Self-pay | Admitting: Vascular Surgery

## 2024-05-02 VITALS — BP 137/81 | HR 88 | Temp 97.9°F | Ht 61.0 in | Wt 158.9 lb

## 2024-05-02 DIAGNOSIS — I6523 Occlusion and stenosis of bilateral carotid arteries: Secondary | ICD-10-CM

## 2024-05-02 DIAGNOSIS — I771 Stricture of artery: Secondary | ICD-10-CM | POA: Diagnosis not present

## 2024-05-02 NOTE — Progress Notes (Signed)
 Patient ID: Ana Gutierrez, female   DOB: [birth year], 78 y.o.   MRN: [id number]  Reason for Consult: New Patient (Initial Visit)   Referred by Ana Charleston, MD  Subjective:     HPI:  Ana Gutierrez is a 78 y.o. female with history of neck pain underwent CT angio which demonstrated left subclavian artery stenosis as well as right carotid artery stenosis.  She is subsequently undergone duplex and is here to discuss results today.  Aspirin  therapy has been initiated.  Patient states that she does have hyperlipidemia but cannot take statins and is on Nexotrol to control her cholesterol.  She does have neck pain.  She denies any history of stroke, TIA or amaurosis.  Patient states that she walks 2 miles every day does not have any claudication, rest pain or tissue loss.   Past Medical History:  Diagnosis Date   Anxiety    Elevated lipase 05/20/2015   GERD (gastroesophageal reflux disease)    Hx of adenomatous colonic polyps 02/2020   2 diminutive - no recall   Hyperlipidemia    Hypothyroidism    Pancreas divisum 05/20/2015   Family History  Problem Relation Age of Onset   Diabetes Mother    Heart disease Mother    Diabetes Maternal Aunt    Colon cancer Maternal Grandfather    Stomach cancer Neg Hx    Esophageal cancer Neg Hx    Rectal cancer Neg Hx    Colon polyps Neg Hx    Past Surgical History:  Procedure Laterality Date   ADRENALECTOMY Right 05/22/14   BRAVO PH STUDY  2011   negative   COLONOSCOPY  05/29/2007   Dr.Mann=hyperplastic polyps - repeat 2021 2 dimin adenomas   ESOPHAGOGASTRODUODENOSCOPY     TUBAL LIGATION     UPPER GASTROINTESTINAL ENDOSCOPY      Short Social History:  Social History   Tobacco Use   Smoking status: Light Smoker    Current packs/day: 0.00    Types: Cigarettes    Last attempt to quit: 12/21/2007    Years since quitting: 16.3   Smokeless tobacco: Never   Tobacco comments:    Quit in 2009-started back smoking   Substance Use Topics    Alcohol use: Not Currently    Alcohol/week: 0.0 standard drinks of alcohol    Allergies  Allergen Reactions   Crestor [Rosuvastatin] Other (See Comments)    aches   Lipitor [Atorvastatin]    Metoprolol  Tartrate Other (See Comments)    Dry mouth    Penicillins Other (See Comments)    Gives her really bad yeast infections   Simvastatin     Other Reaction(s): Unknown    Current Outpatient Medications  Medication Sig Dispense Refill   Ascorbic Acid (VITAMIN C PO) Take by mouth.     aspirin  81 MG chewable tablet Chew 1 tablet (81 mg total) by mouth daily. 30 tablet 0   b complex vitamins tablet Take 1 tablet by mouth daily.     cephALEXin  (KEFLEX ) 500 MG capsule Take 1 capsule (500 mg total) by mouth 4 (four) times daily. 20 capsule 0   Cholecalciferol (VITAMIN D3 PO) Take by mouth.     Coenzyme Q10 (CO Q 10) 100 MG CAPS Take 100 mg by mouth daily.      COLLAGEN PO Take by mouth.     ELDERBERRY PO Take by mouth.     fish oil-omega-3 fatty acids 1000 MG capsule Take 2 g by mouth  daily.      levothyroxine  (EUTHYROX ) 112 MCG tablet Euthyrox  112 mcg tablet     Multiple Vitamins-Minerals (ZINC PO) Take by mouth.     ondansetron  (ZOFRAN -ODT) 4 MG disintegrating tablet Take 1 tablet (4 mg total) by mouth every 8 (eight) hours as needed for nausea or vomiting. 20 tablet 0   pantoprazole  (PROTONIX ) 40 MG tablet Take 1 tablet (40 mg total) by mouth 2 (two) times daily before a meal. Before breakfast + supper 60 tablet 11   Probiotic Product (ACIDOPHILUS/BIFIDUS PO) Take by mouth.     UNABLE TO FIND Med Name: Glucocil po     No current facility-administered medications for this visit.    Review of Systems  Constitutional:  Constitutional negative. HENT: HENT negative.  Eyes: Eyes negative.  GI: Gastrointestinal negative.  Musculoskeletal:       Neck pain Skin: Skin negative.  Neurological: Neurological negative. Hematologic: Hematologic/lymphatic negative.  Psychiatric:  Psychiatric negative.        Objective:  Objective   Vitals:   05/02/24 1444 05/02/24 1446  BP: 131/88 137/81  Pulse: 88   Temp: 97.9 F (36.6 C)   SpO2: 94%   Weight: 158 lb 14.4 oz (72.1 kg)   Height: 5\' 1"  (1.549 m)    Body mass index is 30.02 kg/m.  Physical Exam HENT:     Mouth/Throat:     Mouth: Mucous membranes are moist.  Eyes:     Pupils: Pupils are equal, round, and reactive to light.  Neck:     Vascular: Carotid bruit present.  Cardiovascular:     Rate and Rhythm: Normal rate.     Pulses:          Dorsalis pedis pulses are 0 on the right side.       Posterior tibial pulses are 0 on the right side and 2+ on the left side.     Heart sounds: Murmur heard.  Pulmonary:     Breath sounds: Normal breath sounds.  Abdominal:     General: Abdomen is flat.     Palpations: Abdomen is soft.  Skin:    Capillary Refill: Capillary refill takes less than 2 seconds.  Neurological:     General: No focal deficit present.     Mental Status: She is alert.  Psychiatric:        Mood and Affect: Mood normal.     Data: Right Carotid Findings:  +----------+--------+--------+--------+-------------------------+--------+           PSV cm/sEDV cm/sStenosisPlaque Description       Comments  +----------+--------+--------+--------+-------------------------+--------+  CCA Prox  86      15                                                 +----------+--------+--------+--------+-------------------------+--------+  CCA Mid   59      11                                                 +----------+--------+--------+--------+-------------------------+--------+  CCA Distal49      9                                                  +----------+--------+--------+--------+-------------------------+--------+  ICA Prox  203     62      60-79%  heterogenous and calcific          +----------+--------+--------+--------+-------------------------+--------+  ICA  Mid   142     29                                                 +----------+--------+--------+--------+-------------------------+--------+  ICA Distal81      24                                                 +----------+--------+--------+--------+-------------------------+--------+  ECA      109     0               heterogenous                       +----------+--------+--------+--------+-------------------------+--------+   +----------+--------+-------+----------------+-------------------+           PSV cm/sEDV cmsDescribe        Arm Pressure (mmHG)  +----------+--------+-------+----------------+-------------------+  [id number]     0      Multiphasic, WNL                     +----------+--------+-------+----------------+-------------------+   +---------+--------+--+--------+--+---------+  VertebralPSV cm/s65EDV cm/s12Antegrade  +---------+--------+--+--------+--+---------+      Left Carotid Findings:  +----------+--------+--------+--------+------------------+--------+           PSV cm/sEDV cm/sStenosisPlaque DescriptionComments  +----------+--------+--------+--------+------------------+--------+  CCA Prox  87      18                                          +----------+--------+--------+--------+------------------+--------+  CCA Mid   78      19                                          +----------+--------+--------+--------+------------------+--------+  CCA Distal71      19              heterogenous                +----------+--------+--------+--------+------------------+--------+  ICA Prox  151     43      40-59%  heterogenous                +----------+--------+--------+--------+------------------+--------+  ICA Mid   98      26                                          +----------+--------+--------+--------+------------------+--------+  ICA Distal55      21                                           +----------+--------+--------+--------+------------------+--------+  ECA      61      8                                           +----------+--------+--------+--------+------------------+--------+   +----------+--------+--------+----------------+-------------------+  PSV cm/sEDV cm/sDescribe        Arm Pressure (mmHG)  +----------+--------+--------+----------------+-------------------+  [id number]    0       Multiphasic, WNL                     +----------+--------+--------+----------------+-------------------+   +---------+--------+--+--------+--+---------+  VertebralPSV cm/s42EDV cm/s14Antegrade  +---------+--------+--+--------+--+---------+         Summary:  Right Carotid: Velocities in the right ICA are consistent with a 60-79%                 stenosis.   Left Carotid: Velocities in the left ICA are consistent with a 40-59%  stenosis.   Vertebrals: Bilateral vertebral arteries demonstrate antegrade flow.  Subclavians: Normal flow hemodynamics were seen in bilateral subclavian               arteries.   CT ANGIOGRAPHY HEAD AND NECK WITH AND WITHOUT CONTRAST   TECHNIQUE: Multidetector CT imaging of the head and neck was performed using the standard protocol during bolus administration of intravenous contrast. Multiplanar CT image reconstructions and MIPs were obtained to evaluate the vascular anatomy. Carotid stenosis measurements (when applicable) are obtained utilizing NASCET criteria, using the distal internal carotid diameter as the denominator.   RADIATION DOSE REDUCTION: This exam was performed according to the departmental dose-optimization program which includes automated exposure control, adjustment of the mA and/or kV according to patient size and/or use of iterative reconstruction technique.   CONTRAST:  OMNIPAQUE  IOHEXOL  350 MG/ML SOLN   COMPARISON:  None Available.   FINDINGS: CT HEAD FINDINGS    Brain: No mass,hemorrhage or extra-axial collection. Normal appearance of the parenchyma and CSF spaces.   Vascular: No hyperdense vessel or unexpected vascular calcification.   Skull: The visualized skull base, calvarium and extracranial soft tissues are normal.   Sinuses/Orbits: No fluid levels or advanced mucosal thickening of the visualized paranasal sinuses. No mastoid or middle ear effusion. Normal orbits.   CTA NECK FINDINGS   Skeleton: No acute abnormality or high grade bony spinal canal stenosis.   Other neck: Normal pharynx, larynx and major salivary glands. No cervical lymphadenopathy. Unremarkable thyroid  gland.   Upper chest: No pneumothorax or pleural effusion. No nodules or masses.   Aortic arch: There is calcific atherosclerosis of the aortic arch. Conventional 3 vessel aortic branching pattern. Atherosclerotic calcification within the proximal left subclavian artery causing approximately 70% stenosis.   RIGHT carotid system: No dissection, occlusion or aneurysm. There is calcified atherosclerosis extending into the proximal ICA, resulting in 50% stenosis.   LEFT carotid system: No dissection, occlusion or aneurysm. There is mixed density atherosclerosis extending into the proximal ICA, resulting in approximately 60% stenosis.   Vertebral arteries: Codominant configuration. There is no dissection, occlusion or flow-limiting stenosis to the skull base (V1-V3 segments).   CTA HEAD FINDINGS   POSTERIOR CIRCULATION: Vertebral arteries are normal. No proximal occlusion of the anterior or inferior cerebellar arteries. Basilar artery is normal. Superior cerebellar arteries are normal. Posterior cerebral arteries are normal.   ANTERIOR CIRCULATION: Atherosclerotic calcification of the internal carotid arteries at the skull base without hemodynamically significant stenosis. Anterior cerebral arteries are normal. Middle cerebral arteries are normal.    Venous sinuses: As permitted by contrast timing, patent.   Anatomic variants: None   Review of the MIP images confirms the above findings.   IMPRESSION: 1. No intracranial aneurysm. 2. Approximately 70% stenosis of the proximal left subclavian artery. 3. Approximately 50% stenosis of the proximal right  internal carotid artery.     Assessment/Plan:     78 year old female with asymptomatic left subclavian stenosis and right ICA stenosis confirmed with duplex.  She has begun aspirin .  I have recommended smoking cessation.  We discussed the signs and symptoms of stroke she demonstrates good understanding.  Sure that she will follow-up in 6 months with repeat duplex.     Adine Hoof MD [facility]

## 2024-05-04 ENCOUNTER — Other Ambulatory Visit: Payer: Self-pay | Admitting: *Deleted

## 2024-05-04 DIAGNOSIS — I6523 Occlusion and stenosis of bilateral carotid arteries: Secondary | ICD-10-CM

## 2024-05-10 ENCOUNTER — Ambulatory Visit

## 2024-05-22 DIAGNOSIS — H04203 Unspecified epiphora, bilateral lacrimal glands: Secondary | ICD-10-CM | POA: Diagnosis not present

## 2024-05-22 DIAGNOSIS — H43812 Vitreous degeneration, left eye: Secondary | ICD-10-CM | POA: Diagnosis not present

## 2024-05-31 DIAGNOSIS — M159 Polyosteoarthritis, unspecified: Secondary | ICD-10-CM | POA: Diagnosis not present

## 2024-05-31 DIAGNOSIS — E6609 Other obesity due to excess calories: Secondary | ICD-10-CM | POA: Diagnosis not present

## 2024-05-31 DIAGNOSIS — I6529 Occlusion and stenosis of unspecified carotid artery: Secondary | ICD-10-CM | POA: Diagnosis not present

## 2024-05-31 DIAGNOSIS — R300 Dysuria: Secondary | ICD-10-CM | POA: Diagnosis not present

## 2024-05-31 DIAGNOSIS — J0100 Acute maxillary sinusitis, unspecified: Secondary | ICD-10-CM | POA: Diagnosis not present

## 2024-05-31 DIAGNOSIS — K219 Gastro-esophageal reflux disease without esophagitis: Secondary | ICD-10-CM | POA: Diagnosis not present

## 2024-05-31 DIAGNOSIS — Z6830 Body mass index (BMI) 30.0-30.9, adult: Secondary | ICD-10-CM | POA: Diagnosis not present

## 2024-08-09 DIAGNOSIS — H10411 Chronic giant papillary conjunctivitis, right eye: Secondary | ICD-10-CM | POA: Diagnosis not present

## 2024-08-09 DIAGNOSIS — H43811 Vitreous degeneration, right eye: Secondary | ICD-10-CM | POA: Diagnosis not present

## 2024-08-27 DIAGNOSIS — H52203 Unspecified astigmatism, bilateral: Secondary | ICD-10-CM | POA: Diagnosis not present

## 2024-08-27 DIAGNOSIS — H353131 Nonexudative age-related macular degeneration, bilateral, early dry stage: Secondary | ICD-10-CM | POA: Diagnosis not present

## 2024-08-27 DIAGNOSIS — H04211 Epiphora due to excess lacrimation, right lacrimal gland: Secondary | ICD-10-CM | POA: Diagnosis not present

## 2024-08-27 DIAGNOSIS — H26493 Other secondary cataract, bilateral: Secondary | ICD-10-CM | POA: Diagnosis not present

## 2024-09-19 DIAGNOSIS — E538 Deficiency of other specified B group vitamins: Secondary | ICD-10-CM | POA: Diagnosis not present

## 2024-09-19 DIAGNOSIS — R7303 Prediabetes: Secondary | ICD-10-CM | POA: Diagnosis not present

## 2024-09-19 DIAGNOSIS — Z0000 Encounter for general adult medical examination without abnormal findings: Secondary | ICD-10-CM | POA: Diagnosis not present

## 2024-09-19 DIAGNOSIS — E785 Hyperlipidemia, unspecified: Secondary | ICD-10-CM | POA: Diagnosis not present

## 2024-09-19 DIAGNOSIS — E039 Hypothyroidism, unspecified: Secondary | ICD-10-CM | POA: Diagnosis not present

## 2024-09-19 DIAGNOSIS — E559 Vitamin D deficiency, unspecified: Secondary | ICD-10-CM | POA: Diagnosis not present

## 2024-09-19 DIAGNOSIS — M300 Polyarteritis nodosa: Secondary | ICD-10-CM | POA: Diagnosis not present

## 2024-10-18 DIAGNOSIS — E119 Type 2 diabetes mellitus without complications: Secondary | ICD-10-CM | POA: Diagnosis not present

## 2024-10-18 DIAGNOSIS — E559 Vitamin D deficiency, unspecified: Secondary | ICD-10-CM | POA: Diagnosis not present

## 2024-10-18 DIAGNOSIS — E7800 Pure hypercholesterolemia, unspecified: Secondary | ICD-10-CM | POA: Diagnosis not present

## 2024-10-18 DIAGNOSIS — E890 Postprocedural hypothyroidism: Secondary | ICD-10-CM | POA: Diagnosis not present

## 2024-10-25 DIAGNOSIS — L570 Actinic keratosis: Secondary | ICD-10-CM | POA: Diagnosis not present

## 2024-10-25 DIAGNOSIS — D225 Melanocytic nevi of trunk: Secondary | ICD-10-CM | POA: Diagnosis not present

## 2024-10-25 DIAGNOSIS — Z1283 Encounter for screening for malignant neoplasm of skin: Secondary | ICD-10-CM | POA: Diagnosis not present

## 2024-10-25 DIAGNOSIS — X32XXXA Exposure to sunlight, initial encounter: Secondary | ICD-10-CM | POA: Diagnosis not present

## 2024-10-25 DIAGNOSIS — L820 Inflamed seborrheic keratosis: Secondary | ICD-10-CM | POA: Diagnosis not present

## 2024-11-13 DIAGNOSIS — K1321 Leukoplakia of oral mucosa, including tongue: Secondary | ICD-10-CM | POA: Diagnosis not present

## 2024-11-13 DIAGNOSIS — K1370 Unspecified lesions of oral mucosa: Secondary | ICD-10-CM | POA: Diagnosis not present

## 2024-11-14 ENCOUNTER — Ambulatory Visit (HOSPITAL_COMMUNITY)
Admission: RE | Admit: 2024-11-14 | Discharge: 2024-11-14 | Disposition: A | Discharge status: Home / Self Care | Source: Ambulatory Visit | Attending: Vascular Surgery | Admitting: Vascular Surgery

## 2024-11-14 ENCOUNTER — Encounter: Payer: Self-pay | Admitting: Physician Assistant

## 2024-11-14 ENCOUNTER — Ambulatory Visit: Admitting: Physician Assistant

## 2024-11-14 VITALS — BP 137/81 | HR 78

## 2024-11-14 DIAGNOSIS — I6523 Occlusion and stenosis of bilateral carotid arteries: Secondary | ICD-10-CM | POA: Insufficient documentation

## 2024-11-14 NOTE — Progress Notes (Signed)
 HISTORY AND PHYSICAL     CC:  follow up. Requesting Provider:  Bertell Satterfield, MD  HPI: This is a 78 y.o. female here for follow up for carotid artery stenosis.    Pt was last seen 05/02/2024 initially by Dr. Sheree after having neck pain and CTA that demonstrated left subclavian artery stenosis as well as right carotid artery stenosis. She did not have hx of stroke.  She was walking 2 miles every day and was not having claudication, rest pain or tissue loss.  She was not able to tolerate statins. She was taking asa.   He discussed smoking cessation and recommended 6 month follow up.    Pt returns today for follow up.    Pt denies any amaurosis fugax, speech difficulties, weakness, numbness, paralysis or clumsiness or facial droop.     She states that she was taking Nexletol for her cholesterol, however, it is expensive.  She states that her last cholesterol was 217.  She does continue to smoke but has started Chantix.  She states that her hgbA1c was around 6.3 at last check.    She states she had some heart fluttering and they changed her dosage of thyroid  medicine.  She has not had anymore fluttering.   She just had some dental work yesterday.    The pt is not on a statin for cholesterol management.  The pt is on a daily aspirin .   Other AC:  none The pt is not on medication for hypertension.   The pt is not on medication for diabetes Tobacco hx:  current  Pt does not have family hx of AAA.  Past Medical History:  Diagnosis Date   Anxiety    Elevated lipase 05/20/2015   GERD (gastroesophageal reflux disease)    Hx of adenomatous colonic polyps 02/2020   2 diminutive - no recall   Hyperlipidemia    Hypothyroidism    Pancreas divisum 05/20/2015    Past Surgical History:  Procedure Laterality Date   ADRENALECTOMY Right 05/22/14   BRAVO PH STUDY  2011   negative   COLONOSCOPY  05/29/2007   Dr.Mann=hyperplastic polyps - repeat 2021 2 dimin adenomas    ESOPHAGOGASTRODUODENOSCOPY     TUBAL LIGATION     UPPER GASTROINTESTINAL ENDOSCOPY      Allergies  Allergen Reactions   Crestor [Rosuvastatin] Other (See Comments)    aches   Lipitor [Atorvastatin]    Metoprolol  Tartrate Other (See Comments)    Dry mouth    Penicillins Other (See Comments)    Gives her really bad yeast infections   Simvastatin     Other Reaction(s): Unknown    Current Outpatient Medications  Medication Sig Dispense Refill   Ascorbic Acid (VITAMIN C PO) Take by mouth.     aspirin  81 MG chewable tablet Chew 1 tablet (81 mg total) by mouth daily. 30 tablet 0   b complex vitamins tablet Take 1 tablet by mouth daily.     cephALEXin  (KEFLEX ) 500 MG capsule Take 1 capsule (500 mg total) by mouth 4 (four) times daily. 20 capsule 0   Cholecalciferol (VITAMIN D3 PO) Take by mouth.     Coenzyme Q10 (CO Q 10) 100 MG CAPS Take 100 mg by mouth daily.      COLLAGEN PO Take by mouth.     ELDERBERRY PO Take by mouth.     fish oil-omega-3 fatty acids 1000 MG capsule Take 2 g by mouth daily.      levothyroxine  (EUTHYROX )  112 MCG tablet Euthyrox  112 mcg tablet     Multiple Vitamins-Minerals (ZINC PO) Take by mouth.     ondansetron  (ZOFRAN -ODT) 4 MG disintegrating tablet Take 1 tablet (4 mg total) by mouth every 8 (eight) hours as needed for nausea or vomiting. 20 tablet 0   pantoprazole  (PROTONIX ) 40 MG tablet Take 1 tablet (40 mg total) by mouth 2 (two) times daily before a meal. Before breakfast + supper 60 tablet 11   Probiotic Product (ACIDOPHILUS/BIFIDUS PO) Take by mouth.     UNABLE TO FIND Med Name: Glucocil po     No current facility-administered medications for this visit.    Family History  Problem Relation Age of Onset   Diabetes Mother    Heart disease Mother    Diabetes Maternal Aunt    Colon cancer Maternal Grandfather    Stomach cancer Neg Hx    Esophageal cancer Neg Hx    Rectal cancer Neg Hx    Colon polyps Neg Hx     Social History   Socioeconomic  History   Marital status: Married    Spouse name: Not on file   Number of children: 2   Years of education: Not on file   Highest education level: Not on file  Occupational History   Occupation: work force  Tobacco Use   Smoking status: Light Smoker    Current packs/day: 0.00    Types: Cigarettes    Last attempt to quit: 12/21/2007    Years since quitting: 16.9   Smokeless tobacco: Never   Tobacco comments:    Quit in 2009-started back smoking   Vaping Use   Vaping status: Never Used  Substance and Sexual Activity   Alcohol use: Not Currently    Alcohol/week: 0.0 standard drinks of alcohol   Drug use: No   Sexual activity: Yes    Partners: Male    Birth control/protection: Post-menopausal  Other Topics Concern   Not on file  Social History Narrative   Not on file   Social Drivers of Health   Financial Resource Strain: Not on file  Food Insecurity: Not on file  Transportation Needs: Not on file  Physical Activity: Not on file  Stress: Not on file  Social Connections: Not on file  Intimate Partner Violence: Not on file     REVIEW OF SYSTEMS:   [X]  denotes positive finding, [ ]  denotes negative finding Cardiac  Comments:  Chest pain or chest pressure:    Shortness of breath upon exertion:    Short of breath when lying flat:    Irregular heart rhythm:        Vascular    Pain in calf, thigh, or hip brought on by ambulation:    Pain in feet at night that wakes you up from your sleep:     Blood clot in your veins:    Leg swelling:         Pulmonary    Oxygen at home:    Productive cough:     Wheezing:         Neurologic    Sudden weakness in arms or legs:     Sudden numbness in arms or legs:     Sudden onset of difficulty speaking or slurred speech:    Temporary loss of vision in one eye:     Problems with dizziness:         Gastrointestinal    Blood in stool:     Vomited blood:  Genitourinary    Burning when urinating:     Blood in urine:         Psychiatric    Major depression:         Hematologic    Bleeding problems:    Problems with blood clotting too easily:        Skin    Rashes or ulcers:        Constitutional    Fever or chills:      PHYSICAL EXAMINATION:  Today's Vitals   11/14/24 1518 11/14/24 1519  BP: (!) 144/84 137/81  Pulse: 75 78   There is no height or weight on file to calculate BMI.   General:  WDWN in NAD; vital signs documented above Gait: Not observed HENT: WNL, normocephalic Pulmonary: normal non-labored breathing Cardiac: regular HR, with carotid bruit on the left Abdomen: soft, NT; aortic pulse is not palpable Skin: without rashes Vascular Exam/Pulses:  Right Left  Radial 2+ (normal) 2+ (normal)   Extremities: without open wounds Musculoskeletal: no muscle wasting or atrophy  Neurologic: A&O X 3; moving all extremities equally; speech is fluent/normal Psychiatric:  The pt has Normal affect.   Non-Invasive Vascular Imaging:   Carotid Duplex on 11/14/2024 Right:  40-59% ICA stenosis  Elevated velocities in the right proximal CCA. Unable to quantify degree of stenosis; pre-stenosis velocity cannot be obtained due to anatomical location   Left:  40-59% ICA stenosis Vertebrals:  Bilateral vertebral arteries demonstrate antegrade flow.  Subclavians: Normal flow hemodynamics were seen in bilateral subclavian arteries   Previous Carotid duplex on 04/23/2024: Right: 60-79% ICA stenosis Left:   40-59% ICA stenosis Vertebrals:  Bilateral vertebral arteries demonstrate antegrade flow.  Subclavians: Normal flow hemodynamics were seen in bilateral subclavian arteries.    ASSESSMENT/PLAN:: 78 y.o. female here for follow up carotid artery and asymptomatic left subclavian artery stenosis.   -duplex today reveals 40-59% bilateral ICA stenosis. The right has decreased from 60-79% but most likely unchanged given velocities.  She remains asymptomatic.  -she has easily palpable radial pulses  bilaterally. -discussed s/s of stroke with pt and she understands should she develop any of these sx, she will go to the nearest ER or call 911. -pt will f/u in 6 months with carotid duplex.  If remains 40-59%, will push out to a year. She will see Dr. Sheree at the next visit since she was not able to see him today. -pt will call sooner should she have any issues. -continue asa - has allergy to statin but has been taking Nexletol but unfortunately, it has been expensive.  She is hoping with change in insurance it will be more affordable.   -Discussed with her the importance of smoking cessation and that it puts them at increased risk for limb loss, heart attack, stroke, cancers, lung problems.  She is currently on Chantix and working on quitting.  We discussed getting regular exercise, healthy diet.    Ana Gutierrez, [facility] [phone]  Clinic MD:  Sheree

## 2024-11-19 ENCOUNTER — Other Ambulatory Visit: Payer: Self-pay

## 2024-11-19 DIAGNOSIS — I6523 Occlusion and stenosis of bilateral carotid arteries: Secondary | ICD-10-CM

## 2024-12-03 DIAGNOSIS — Z23 Encounter for immunization: Secondary | ICD-10-CM | POA: Diagnosis not present

## 2025-05-15 ENCOUNTER — Ambulatory Visit: Admitting: Vascular Surgery

## 2025-05-15 ENCOUNTER — Ambulatory Visit (HOSPITAL_COMMUNITY)
